# Patient Record
Sex: Female | Born: 1950 | Race: Black or African American | Smoking: Never smoker
Health system: Southern US, Community
[De-identification: ages and names within clinical notes are randomized; demographics above are authoritative.]

## PROBLEM LIST (undated history)

## (undated) DIAGNOSIS — N189 Chronic kidney disease, unspecified: Secondary | ICD-10-CM

## (undated) DIAGNOSIS — E669 Obesity, unspecified: Secondary | ICD-10-CM

## (undated) DIAGNOSIS — M1990 Unspecified osteoarthritis, unspecified site: Secondary | ICD-10-CM

## (undated) DIAGNOSIS — I10 Essential (primary) hypertension: Secondary | ICD-10-CM

## (undated) DIAGNOSIS — E119 Type 2 diabetes mellitus without complications: Secondary | ICD-10-CM

## (undated) DIAGNOSIS — E785 Hyperlipidemia, unspecified: Secondary | ICD-10-CM

## (undated) HISTORY — DX: Hyperlipidemia, unspecified: E78.5

## (undated) HISTORY — DX: Type 2 diabetes mellitus without complications: E11.9

## (undated) HISTORY — DX: Chronic kidney disease, unspecified: N18.9

## (undated) HISTORY — DX: Essential (primary) hypertension: I10

## (undated) HISTORY — PX: EYE SURGERY: SHX253

## (undated) HISTORY — DX: Obesity, unspecified: E66.9

## (undated) HISTORY — DX: Unspecified osteoarthritis, unspecified site: M19.90

---

## 2017-11-05 ENCOUNTER — Telehealth: Payer: Self-pay

## 2017-11-06 NOTE — Telephone Encounter (Signed)
Patient said it was her New Patient Paperwork. Her address was incorrect in the system. I have updated this info. It is [address] [place] [phone]

## 2017-11-06 NOTE — Telephone Encounter (Signed)
Left voice mail to call back ok for PEC to speak to patient, need to find out what call what was in reference to

## 2017-11-29 ENCOUNTER — Ambulatory Visit: Payer: Medicare HMO | Admitting: Internal Medicine

## 2017-12-26 ENCOUNTER — Telehealth: Payer: Self-pay | Admitting: Radiology

## 2017-12-26 ENCOUNTER — Ambulatory Visit: Payer: Medicare HMO

## 2017-12-26 ENCOUNTER — Ambulatory Visit (INDEPENDENT_AMBULATORY_CARE_PROVIDER_SITE_OTHER): Payer: Medicare HMO | Admitting: Internal Medicine

## 2017-12-26 ENCOUNTER — Encounter: Payer: Self-pay | Admitting: Internal Medicine

## 2017-12-26 VITALS — BP 150/70 | HR 75 | Temp 98.0°F | Ht 65.0 in | Wt 195.6 lb

## 2017-12-26 DIAGNOSIS — M25511 Pain in right shoulder: Secondary | ICD-10-CM | POA: Diagnosis not present

## 2017-12-26 DIAGNOSIS — E1122 Type 2 diabetes mellitus with diabetic chronic kidney disease: Secondary | ICD-10-CM | POA: Diagnosis not present

## 2017-12-26 DIAGNOSIS — I10 Essential (primary) hypertension: Secondary | ICD-10-CM | POA: Diagnosis not present

## 2017-12-26 DIAGNOSIS — Z23 Encounter for immunization: Secondary | ICD-10-CM

## 2017-12-26 DIAGNOSIS — E119 Type 2 diabetes mellitus without complications: Secondary | ICD-10-CM | POA: Insufficient documentation

## 2017-12-26 DIAGNOSIS — M25519 Pain in unspecified shoulder: Secondary | ICD-10-CM | POA: Insufficient documentation

## 2017-12-26 DIAGNOSIS — N183 Chronic kidney disease, stage 3 unspecified: Secondary | ICD-10-CM

## 2017-12-26 DIAGNOSIS — E785 Hyperlipidemia, unspecified: Secondary | ICD-10-CM | POA: Diagnosis not present

## 2017-12-26 DIAGNOSIS — Z1329 Encounter for screening for other suspected endocrine disorder: Secondary | ICD-10-CM

## 2017-12-26 DIAGNOSIS — E559 Vitamin D deficiency, unspecified: Secondary | ICD-10-CM | POA: Diagnosis not present

## 2017-12-26 LAB — COMPREHENSIVE METABOLIC PANEL
ALBUMIN: 4.2 g/dL (ref 3.5–5.2)
ALK PHOS: 54 U/L (ref 39–117)
ALT: 10 U/L (ref 0–35)
AST: 15 U/L (ref 0–37)
BUN: 15 mg/dL (ref 6–23)
CALCIUM: 9.7 mg/dL (ref 8.4–10.5)
CHLORIDE: 102 meq/L (ref 96–112)
CO2: 28 mEq/L (ref 19–32)
Creatinine, Ser: 1.22 mg/dL — ABNORMAL HIGH (ref 0.40–1.20)
GFR: 56.24 mL/min — AB (ref >60.00)
Glucose, Bld: 58 mg/dL — ABNORMAL LOW (ref 70–99)
POTASSIUM: 3.6 meq/L (ref 3.5–5.1)
SODIUM: 140 meq/L (ref 135–145)
TOTAL PROTEIN: 7.5 g/dL (ref 6.0–8.3)
Total Bilirubin: 0.3 mg/dL (ref 0.2–1.2)

## 2017-12-26 LAB — CBC WITH DIFFERENTIAL/PLATELET
BASOS PCT: 0.5 % (ref 0.0–3.0)
Basophils Absolute: 0 10*3/uL (ref 0.0–0.1)
EOS PCT: 1.6 % (ref 0.0–5.0)
Eosinophils Absolute: 0.1 10*3/uL (ref 0.0–0.7)
HEMATOCRIT: 35.4 % — AB (ref 36.0–46.0)
HEMOGLOBIN: 11.6 g/dL — AB (ref 12.0–15.0)
LYMPHS PCT: 28.8 % (ref 12.0–46.0)
Lymphs Abs: 2.5 10*3/uL (ref 0.7–4.0)
MCHC: 32.8 g/dL (ref 30.0–36.0)
MCV: 90 fl (ref 78.0–100.0)
MONOS PCT: 9 % (ref 3.0–12.0)
Monocytes Absolute: 0.8 10*3/uL (ref 0.1–1.0)
Neutro Abs: 5.2 10*3/uL (ref 1.4–7.7)
Neutrophils Relative %: 60.1 % (ref 43.0–77.0)
Platelets: 284 10*3/uL (ref 150.0–400.0)
RBC: 3.94 Mil/uL (ref 3.87–5.11)
RDW: 14.1 % (ref 11.5–15.5)
WBC: 8.6 10*3/uL (ref 4.0–10.5)

## 2017-12-26 LAB — URINALYSIS, ROUTINE W REFLEX MICROSCOPIC
Bilirubin Urine: NEGATIVE
HGB URINE DIPSTICK: NEGATIVE
Ketones, ur: NEGATIVE
NITRITE: NEGATIVE
RBC / HPF: NONE SEEN (ref 0–?)
Specific Gravity, Urine: 1.02 (ref 1.000–1.030)
TOTAL PROTEIN, URINE-UPE24: NEGATIVE
URINE GLUCOSE: NEGATIVE
Urobilinogen, UA: 0.2 (ref 0.0–1.0)
pH: 5.5 (ref 5.0–8.0)

## 2017-12-26 LAB — MICROALBUMIN / CREATININE URINE RATIO
Creatinine,U: 100.3 mg/dL
MICROALB/CREAT RATIO: 0.7 mg/g (ref 0.0–30.0)

## 2017-12-26 LAB — TSH: TSH: 2.45 u[IU]/mL (ref 0.35–4.50)

## 2017-12-26 LAB — HEMOGLOBIN A1C: HEMOGLOBIN A1C: 6.7 % — AB (ref 4.6–6.5)

## 2017-12-26 LAB — VITAMIN D 25 HYDROXY (VIT D DEFICIENCY, FRACTURES): VITD: 22.46 ng/mL — ABNORMAL LOW (ref 30.00–100.00)

## 2017-12-26 MED ORDER — TELMISARTAN-AMLODIPINE 80-10 MG PO TABS: 1.0000 | ORAL_TABLET | ORAL | 1 refills | Status: DC

## 2017-12-26 MED ORDER — TRUE METRIX BLOOD GLUCOSE TEST VI STRP: ORAL_STRIP | 3 refills | Status: DC

## 2017-12-26 MED ORDER — SIMVASTATIN 20 MG PO TABS: 20.0000 mg | ORAL_TABLET | Freq: Every day | ORAL | 3 refills | Status: DC

## 2017-12-26 MED ORDER — GLIPIZIDE-METFORMIN HCL 5-500 MG PO TABS
2.0000 | ORAL_TABLET | Freq: Two times a day (BID) | ORAL | 1 refills | Status: DC
Start: 2017-12-26 — End: 2017-12-29

## 2017-12-26 MED ORDER — HYDROCHLOROTHIAZIDE 25 MG PO TABS: 25.0000 mg | ORAL_TABLET | Freq: Every day | ORAL | 1 refills | Status: DC

## 2017-12-26 NOTE — Progress Notes (Signed)
Chief Complaint  Patient presents with  . 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 Needs medication refills  1. HTN out of BP meds x 2 months was on hctz 25, norvasc 10/valsartan 160 mg qd but valsartan recalled. BP elevated today  2. DM 2 last A1C 7.4 11/22/15 on Glipizide-metformin 5-500 mg 2 pills bid on zocor 20 mg rec she take qhs  Needs refills of her test strips   3. C/o right shoulder pain x 2 months no injury using OTC topical cream to shoulder     Review of Systems  Constitutional: Negative for weight loss.  HENT: Negative for hearing loss.   Eyes: Negative for blurred vision.  Respiratory: Negative for shortness of breath.   Cardiovascular: Negative for chest pain.  Gastrointestinal: Negative for abdominal pain and blood in stool.  Musculoskeletal: Positive for joint pain.  Skin: Negative for rash.  Neurological: Negative for headaches.  Psychiatric/Behavioral: Negative for memory loss.   Past Medical History:  Diagnosis Date  . Arthritis    knees  . Chronic kidney disease   . Diabetes mellitus without complication (HCC)   . Hyperlipidemia   . Hypertension   . Obesity    Past Surgical History:  Procedure Laterality Date  . EYE SURGERY     b/l cataract 2004    Family History  Problem Relation Age of Onset  . Arthritis Sister   . Diabetes Son   . Mental illness Son        bipolar   . Hyperlipidemia Son   . Hypertension Son   . Arthritis Sister   . Arthritis Sister   . Arthritis Sister   . Hyperlipidemia Sister   . Hypertension Sister    Social History   Socioeconomic History  . Marital status: Unknown    Spouse name: Not on file  . Number of children: Not on file  . Years of education: Not on file  . Highest education level: Not on file  Social Needs  . Financial resource strain: Not on file  . Food insecurity - worry: Not on file  . Food insecurity - inability: Not on file  . Transportation needs - medical:  Not on file  . Transportation needs - non-medical: Not on file  Occupational History  . Not on file  Tobacco Use  . Smoking status: Never Smoker  . Smokeless tobacco: Never Used  Substance and Sexual Activity  . Alcohol use: No    Frequency: Never  . Drug use: No  . Sexual activity: No    Partners: Male  Other Topics Concern  . Not on file  Social History Narrative   No etoh, drugs, never smoker    Walks her dog    No guns, wears seatbelt    Lives with son    Current Meds  Medication Sig  . aspirin EC 81 MG tablet Take 81 mg by mouth daily.  Marland Kitchen glipiZIDE-metformin (METAGLIP) 5-500 MG tablet Take 2 tablets by mouth 2 (two) times daily before a meal.  . hydrochlorothiazide (HYDRODIURIL) 25 MG tablet Take 1 tablet (25 mg total) by mouth daily.  . simvastatin (ZOCOR) 20 MG tablet Take 1 tablet (20 mg total) by mouth daily at 6 PM.  . TRUE METRIX BLOOD GLUCOSE TEST test strip Bid E11.9  . TRUEPLUS LANCETS 28G MISC   . [DISCONTINUED] glipiZIDE-metformin (METAGLIP) 5-500 MG tablet Take 2 tablets by mouth 2 (two) times daily before a meal.   . [  DISCONTINUED] hydrochlorothiazide (HYDRODIURIL) 25 MG tablet   . [DISCONTINUED] simvastatin (ZOCOR) 20 MG tablet   . [DISCONTINUED] TRUE METRIX BLOOD GLUCOSE TEST test strip    No Known Allergies No results found for this or any previous visit (from the past 2160 hour(s)). Objective  Body mass index is 32.55 kg/m. Wt Readings from Last 3 Encounters:  12/26/17 195 lb 9.6 oz (88.7 kg)   Temp Readings from Last 3 Encounters:  12/26/17 98 F (36.7 C) (Oral)   BP Readings from Last 3 Encounters:  12/26/17 (!) 150/70   Pulse Readings from Last 3 Encounters:  12/26/17 75   O2 sat room air 98%   Physical Exam  Constitutional: She is oriented to person, place, and time and well-developed, well-nourished, and in no distress.  HENT:  Head: Normocephalic and atraumatic.  Mouth/Throat: Oropharynx is clear and moist and mucous membranes are  normal.  Eyes: Conjunctivae are normal. Pupils are equal, round, and reactive to light.  Cardiovascular: Normal rate, regular rhythm and normal heart sounds.  Trace leg edema b/l   Pulmonary/Chest: Effort normal and breath sounds normal.  Abdominal: Soft. Bowel sounds are normal.  Musculoskeletal:       Arms: Neurological: She is alert and oriented to person, place, and time. Gait normal. Gait normal.  Skin: Skin is warm, dry and intact.  Psychiatric: Mood, memory, affect and judgment normal.  Nursing note and vitals reviewed. trace edema b/l legs   Assessment   1. HTN uncontrolled 2/2 out of meds x 2 months  2. DM 2 with CKD 3 last Creatinine 1.64 11/22/15 with GFR 37 and per notes h/o neuropathy, A1c 7.4 11/22/15 per pt cbg this am was 145 with coffee  3. HLD 4. Right shoulder pain, new x 2 months  5. H M Plan  1. Refilled hctz 25 mg qd, change norvasc 10/valsartan 160 to norvasc 10/telmisartan 80  Advised pt to take combo pill x 3 days then add hctz day 4 and continue both  F/u in 2 months  Labs today CMET, CBC,A1C, UA/protein, vit D, TSH, T4  2. Refilled glipizide-metformin 5-500 2 pills bid  Refilled strips  On ARB  Refilled statin  [facility]  Will need to do foot exam in future  3. Check lipid at f/u appt  11/22/15 TC 171, TGs 165. HDL 60  4. Ordered Xray shoulder today pt declines. Will continue to use otc topical cream  5.  [facility] flu shot [facility] per pt 09/2017  prevnar given today pna 23 needs in 1 year  Tdap disc'ed will give info at f/u  Declines to be tested hep B/C  Declines mammo, pap, colonoscopy, disc cologaurd today but mother died of colon cancer not candidate consider fobt stool tests in future, disc DEXA pt declines  "I spent 30 minutes face-to face with patient with greater than 50% of time spent counseling and/or in coordination of care reviewed PMH, psuH, prior PCP records chart reflects accordingly    Provider: Dr. French Anaracy  [facility]

## 2017-12-26 NOTE — Telephone Encounter (Signed)
Pt refused shoulder xray. Pt stated no one told her about xray.

## 2017-12-26 NOTE — Progress Notes (Signed)
Pre visit review using our clinic review tool, if applicable. No additional management support is needed unless otherwise documented below in the visit note. 

## 2017-12-26 NOTE — Telephone Encounter (Signed)
That is fine I told her about Xray  Will disc at f/u   TMS

## 2017-12-26 NOTE — Patient Instructions (Signed)
Start combination pill Telmisartan-Norvasc for 3 days  Then start hydrochlorothiazide on day 4 and continue both after that  F/u in 2 months. Am appt fasting will do bloodwork   Pneumococcal Conjugate Vaccine suspension for injection What is this medicine? PNEUMOCOCCAL VACCINE (NEU mo KOK al vak SEEN) is a vaccine used to prevent pneumococcus bacterial infections. These bacteria can cause serious infections like pneumonia, meningitis, and blood infections. This vaccine will lower your chance of getting pneumonia. If you do get pneumonia, it can make your symptoms milder and your illness shorter. This vaccine will not treat an infection and will not cause infection. This vaccine is recommended for infants and young children, adults with certain medical conditions, and adults 65 years or older. This medicine may be used for other purposes; ask your health care provider or pharmacist if you have questions. COMMON BRAND NAME(S): Prevnar, Prevnar 13 What should I tell my health care provider before I take this medicine? They need to know if you have any of these conditions: -bleeding problems -fever -immune system problems -an unusual or allergic reaction to pneumococcal vaccine, diphtheria toxoid, other vaccines, latex, other medicines, foods, dyes, or preservatives -pregnant or trying to get pregnant -breast-feeding How should I use this medicine? This vaccine is for injection into a muscle. It is given by a health care professional. A copy of Vaccine Information Statements will be given before each vaccination. Read this sheet carefully each time. The sheet may change frequently. Talk to your pediatrician regarding the use of this medicine in children. While this drug may be prescribed for children as young as 63 weeks old for selected conditions, precautions do apply. Overdosage: If you think you have taken too much of this medicine contact a poison control center or emergency room at once. NOTE:  This medicine is only for you. Do not share this medicine with others. What if I miss a dose? It is important not to miss your dose. Call your doctor or health care professional if you are unable to keep an appointment. What may interact with this medicine? -medicines for cancer chemotherapy -medicines that suppress your immune function -steroid medicines like prednisone or cortisone This list may not describe all possible interactions. Give your health care provider a list of all the medicines, herbs, non-prescription drugs, or dietary supplements you use. Also tell them if you smoke, drink alcohol, or use illegal drugs. Some items may interact with your medicine. What should I watch for while using this medicine? Mild fever and pain should go away in 3 days or less. Report any unusual symptoms to your doctor or health care professional. What side effects may I notice from receiving this medicine? Side effects that you should report to your doctor or health care professional as soon as possible: -allergic reactions like skin rash, itching or hives, swelling of the face, lips, or tongue -breathing problems -confused -fast or irregular heartbeat -fever over 102 degrees F -seizures -unusual bleeding or bruising -unusual muscle weakness Side effects that usually do not require medical attention (report to your doctor or health care professional if they continue or are bothersome): -aches and pains -diarrhea -fever of 102 degrees F or less -headache -irritable -loss of appetite -pain, tender at site where injected -trouble sleeping This list may not describe all possible side effects. Call your doctor for medical advice about side effects. You may report side effects to FDA at 1-800-FDA-1088. Where should I keep my medicine? This does not apply. This vaccine is  given in a clinic, pharmacy, doctor's office, or other health care setting and will not be stored at home. NOTE: This sheet is a  summary. It may not cover all possible information. If you have questions about this medicine, talk to your doctor, pharmacist, or health care provider.  2018 Elsevier/Gold Standard (2014-07-09 10:27:27)

## 2017-12-29 ENCOUNTER — Other Ambulatory Visit: Payer: Self-pay | Admitting: Internal Medicine

## 2017-12-29 DIAGNOSIS — E559 Vitamin D deficiency, unspecified: Secondary | ICD-10-CM

## 2017-12-29 DIAGNOSIS — E1122 Type 2 diabetes mellitus with diabetic chronic kidney disease: Secondary | ICD-10-CM

## 2017-12-29 MED ORDER — GLIPIZIDE-METFORMIN HCL 5-500 MG PO TABS: ORAL_TABLET | ORAL | 1 refills | Status: DC

## 2017-12-29 MED ORDER — CHOLECALCIFEROL 1.25 MG (50000 UT) PO CAPS: 50000.0000 [IU] | ORAL_CAPSULE | ORAL | 1 refills | Status: DC

## 2018-02-06 ENCOUNTER — Telehealth: Payer: Self-pay | Admitting: Internal Medicine

## 2018-02-06 NOTE — Telephone Encounter (Unsigned)
Copied from CRM [id number]. Topic: Quick Communication - See Telephone Encounter >> Feb 06, 2018 12:10 PM Floria RavelingStovall, Shana A wrote: CRM for notification. See Telephone encounter for: 02/06/18. Pt called stated that the new bp med that was called in this month, pharmacy does not have it in.  They are unsure when they ware going to get it in.  She did not know the name of this med. She wanted to know if dr could call in different bp med?    Pharmacy - [facility] on file- verified

## 2018-02-06 NOTE — Telephone Encounter (Signed)
Please advise. It is the combo amlodipine medication.

## 2018-02-07 ENCOUNTER — Other Ambulatory Visit: Payer: Self-pay | Admitting: Internal Medicine

## 2018-02-07 DIAGNOSIS — I10 Essential (primary) hypertension: Secondary | ICD-10-CM

## 2018-02-07 MED ORDER — TELMISARTAN 40 MG PO TABS: 40.0000 mg | ORAL_TABLET | Freq: Every day | ORAL | 0 refills | Status: DC

## 2018-02-07 MED ORDER — AMLODIPINE BESYLATE 10 MG PO TABS: 10.0000 mg | ORAL_TABLET | Freq: Every day | ORAL | 0 refills | Status: DC

## 2018-02-07 NOTE — Telephone Encounter (Signed)
Sent telmisartan 40, norvasc 10 instead of combination pill

## 2018-02-08 NOTE — Telephone Encounter (Signed)
Patient has been informed of below.  

## 2018-02-08 NOTE — Telephone Encounter (Signed)
Left message for patient to return call back. PEC may inform patient 

## 2018-02-28 ENCOUNTER — Ambulatory Visit: Payer: Medicare HMO | Admitting: Internal Medicine

## 2018-04-02 ENCOUNTER — Encounter: Payer: Self-pay | Admitting: Internal Medicine

## 2018-04-02 ENCOUNTER — Ambulatory Visit (INDEPENDENT_AMBULATORY_CARE_PROVIDER_SITE_OTHER): Payer: Medicare HMO | Admitting: Internal Medicine

## 2018-04-02 VITALS — BP 126/62 | HR 74 | Temp 98.4°F | Ht 65.0 in | Wt 183.4 lb

## 2018-04-02 DIAGNOSIS — E559 Vitamin D deficiency, unspecified: Secondary | ICD-10-CM | POA: Insufficient documentation

## 2018-04-02 DIAGNOSIS — I10 Essential (primary) hypertension: Secondary | ICD-10-CM | POA: Diagnosis not present

## 2018-04-02 DIAGNOSIS — E785 Hyperlipidemia, unspecified: Secondary | ICD-10-CM

## 2018-04-02 DIAGNOSIS — E1122 Type 2 diabetes mellitus with diabetic chronic kidney disease: Secondary | ICD-10-CM

## 2018-04-02 LAB — LIPID PANEL
CHOL/HDL RATIO: 4
Cholesterol: 259 mg/dL — ABNORMAL HIGH (ref 0–200)
HDL: 61.6 mg/dL (ref >39.00)
LDL CALC: 168 mg/dL — AB (ref 0–99)
NONHDL: 197.1
Triglycerides: 148 mg/dL (ref 0.0–149.0)
VLDL: 29.6 mg/dL (ref 0.0–40.0)

## 2018-04-02 LAB — BASIC METABOLIC PANEL
BUN: 20 mg/dL (ref 6–23)
CALCIUM: 10 mg/dL (ref 8.4–10.5)
CHLORIDE: 102 meq/L (ref 96–112)
CO2: 28 meq/L (ref 19–32)
CREATININE: 1.73 mg/dL — AB (ref 0.40–1.20)
GFR: 37.56 mL/min — ABNORMAL LOW (ref >60.00)
Glucose, Bld: 107 mg/dL — ABNORMAL HIGH (ref 70–99)
Potassium: 4 mEq/L (ref 3.5–5.1)
SODIUM: 140 meq/L (ref 135–145)

## 2018-04-02 LAB — HEMOGLOBIN A1C: Hgb A1c MFr Bld: 6.9 % — ABNORMAL HIGH (ref 4.6–6.5)

## 2018-04-02 MED ORDER — GLIPIZIDE-METFORMIN HCL 5-500 MG PO TABS: ORAL_TABLET | ORAL | 3 refills | Status: DC

## 2018-04-02 MED ORDER — SIMVASTATIN 20 MG PO TABS: 20.0000 mg | ORAL_TABLET | Freq: Every day | ORAL | 3 refills | Status: DC

## 2018-04-02 MED ORDER — CHOLECALCIFEROL 1.25 MG (50000 UT) PO CAPS: 50000.0000 [IU] | ORAL_CAPSULE | ORAL | 1 refills | Status: DC

## 2018-04-02 MED ORDER — ASPIRIN EC 81 MG PO TBEC: 81.0000 mg | DELAYED_RELEASE_TABLET | Freq: Every day | ORAL | 3 refills | Status: DC

## 2018-04-02 MED ORDER — TRUEPLUS LANCETS 28G MISC: 3.0000 | Freq: Three times a day (TID) | 11 refills | Status: DC

## 2018-04-02 MED ORDER — AMLODIPINE BESYLATE 10 MG PO TABS: 10.0000 mg | ORAL_TABLET | Freq: Every day | ORAL | 3 refills | Status: DC

## 2018-04-02 MED ORDER — HYDROCHLOROTHIAZIDE 25 MG PO TABS: 25.0000 mg | ORAL_TABLET | Freq: Every day | ORAL | 3 refills | Status: DC

## 2018-04-02 MED ORDER — TELMISARTAN 40 MG PO TABS: 40.0000 mg | ORAL_TABLET | Freq: Every day | ORAL | 3 refills | Status: DC

## 2018-04-02 MED ORDER — TRUE METRIX BLOOD GLUCOSE TEST VI STRP: ORAL_STRIP | 3 refills | Status: DC

## 2018-04-02 NOTE — Progress Notes (Signed)
Pre visit review using our clinic review tool, if applicable. No additional management support is needed unless otherwise documented below in the visit note. 

## 2018-04-02 NOTE — Patient Instructions (Addendum)
Wear sandals visit so we can check feet  And have [employer] fax me the results of the stool kit  F/u 3-4 months  Please make eye doctor appt at [facility]    Hypoglycemia Hypoglycemia is when the sugar (glucose) level in the blood is too low. Symptoms of low blood sugar may include:  Feeling: ? Hungry. ? Worried or nervous (anxious). ? Sweaty and clammy. ? Confused. ? Dizzy. ? Sleepy. ? Sick to your stomach (nauseous).  Having: ? A fast heartbeat. ? A headache. ? A change in your vision. ? Jerky movements that you cannot control (seizure). ? Nightmares. ? Tingling or no feeling (numbness) around the mouth, lips, or tongue.  Having trouble with: ? Talking. ? Paying attention (concentrating). ? Moving (coordination). ? Sleeping.  Shaking.  Passing out (fainting).  Getting upset easily (irritability).  Low blood sugar can happen to people who have diabetes and people who do not have diabetes. Low blood sugar can happen quickly, and it can be an emergency. Treating Low Blood Sugar Low blood sugar is often treated by eating or drinking something sugary right away. If you can think clearly and swallow safely, follow the 15:15 rule:  Take 15 grams of a fast-acting carb (carbohydrate). Some fast-acting carbs are: ? 1 tube of glucose gel. ? 3 sugar tablets (glucose pills). ? 6-8 pieces of hard candy. ? 4 oz (120 mL) of fruit juice. ? 4 oz (120 mL) of regular (not diet) soda.  Check your blood sugar 15 minutes after you take the carb.  If your blood sugar is still at or below 70 mg/dL (3.9 mmol/L), take 15 grams of a carb again.  If your blood sugar does not go above 70 mg/dL (3.9 mmol/L) after 3 tries, get help right away.  After your blood sugar goes back to normal, eat a meal or a snack within 1 hour.  Treating Very Low Blood Sugar If your blood sugar is at or below 54 mg/dL (3 mmol/L), you have very low blood sugar (severe hypoglycemia). This is an emergency. Do not wait  to see if the symptoms will go away. Get medical help right away. Call your local emergency services (911 in the [place] Do not drive yourself to the hospital. If you have very low blood sugar and you cannot eat or drink, you may need a glucagon shot (injection). A family member or friend should learn how to check your blood sugar and how to give you a glucagon shot. Ask your doctor if you need to have a glucagon shot kit at home. Follow these instructions at home: General instructions  Avoid any diets that cause you to not eat enough food. Talk with your doctor before you start any new diet.  Take over-the-counter and prescription medicines only as told by your doctor.  Limit alcohol to no more than 1 drink per day for nonpregnant women and 2 drinks per day for men. One drink equals 12 oz of beer, 5 oz of wine, or 1 oz of hard liquor.  Keep all follow-up visits as told by your doctor. This is important. If You Have Diabetes:   Make sure you know the symptoms of low blood sugar.  Always keep a source of sugar with you, such as: ? Sugar. ? Sugar tablets. ? Glucose gel. ? Fruit juice. ? Regular soda (not diet soda). ? Milk. ? Hard candy. ? Honey.  Take your medicines as told.  Follow your exercise and meal plan. ? Eat  on time. Do not skip meals. ? Follow your sick day plan when you cannot eat or drink normally. Make this plan ahead of time with your doctor.  Check your blood sugar as often as told by your doctor. Always check before and after exercise.  Share your diabetes care plan with: ? Your work or school. ? People you live with.  Check your pee (urine) for ketones: ? When you are sick. ? As told by your doctor.  Carry a card or wear jewelry that says you have diabetes. If You Have Low Blood Sugar From Other Causes:   Check your blood sugar as often as told by your doctor.  Follow instructions from your doctor about what you cannot eat or drink. Contact a doctor  if:  You have trouble keeping your blood sugar in your target range.  You have low blood sugar often. Get help right away if:  You still have symptoms after you eat or drink something sugary.  Your blood sugar is at or below 54 mg/dL (3 mmol/L).  You have jerky movements that you cannot control.  You pass out. These symptoms may be an emergency. Do not wait to see if the symptoms will go away. Get medical help right away. Call your local emergency services (911 in the [place] Do not drive yourself to the hospital. This information is not intended to replace advice given to you by your health care provider. Make sure you discuss any questions you have with your health care provider. Document Released: 12/27/2009 Document Revised: 03/09/2016 Document Reviewed: 11/05/2015 Elsevier Interactive Patient Education  [employer].

## 2018-04-02 NOTE — Progress Notes (Signed)
Chief Complaint  Patient presents with  . Follow-up   F/u  1. HTN controlled on norvasc 10, hctz 25, telmisartan 40 2. DM 2 last A1C 6.7 on metacip 5-500 2 pills in am and 1 in pm. [facility] checking cbgs 2-3 x per day 79-131 readings at home  3. Right shoulder pain resolved  4. Vit D def taking weekly vitamin D 3   Review of Systems  Constitutional:       Down 12 lbs since last visit   HENT: Negative for hearing loss.   Eyes: Negative for blurred vision.  Respiratory: Negative for shortness of breath.   Cardiovascular: Negative for chest pain.  Gastrointestinal: Negative for abdominal pain.  Musculoskeletal: Negative for joint pain.  Skin: Negative for rash.  Neurological: Negative for headaches.  Psychiatric/Behavioral: Negative for depression.   Past Medical History:  Diagnosis Date  . Arthritis    knees  . Chronic kidney disease   . Diabetes mellitus without complication (HCC)   . Hyperlipidemia   . Hypertension   . Obesity    Past Surgical History:  Procedure Laterality Date  . EYE SURGERY     b/l cataract 2004    Family History  Problem Relation Age of Onset  . Arthritis Sister   . Diabetes Son   . Mental illness Son        bipolar   . Hyperlipidemia Son   . Hypertension Son   . Arthritis Sister   . Arthritis Sister   . Arthritis Sister   . Hyperlipidemia Sister   . Hypertension Sister   . Cancer Mother        colon cancer   . Cancer Father        lung, smoker   Social History   Socioeconomic History  . Marital status: Unknown    Spouse name: Not on file  . Number of children: Not on file  . Years of education: Not on file  . Highest education level: Not on file  Occupational History  . Not on file  Social Needs  . Financial resource strain: Not on file  . Food insecurity:    Worry: Not on file    Inability: Not on file  . Transportation needs:    Medical: Not on file    Non-medical: Not on file  Tobacco  Use  . Smoking status: Never Smoker  . Smokeless tobacco: Never Used  Substance and Sexual Activity  . Alcohol use: No    Frequency: Never  . Drug use: No  . Sexual activity: Never    Partners: Male  Lifestyle  . Physical activity:    Days per week: Not on file    Minutes per session: Not on file  . Stress: Not on file  Relationships  . Social connections:    Talks on phone: Not on file    Gets together: Not on file    Attends religious service: Not on file    Active member of club or organization: Not on file    Attends meetings of clubs or organizations: Not on file    Relationship status: Not on file  . Intimate partner violence:    Fear of current or ex partner: Not on file    Emotionally abused: Not on file    Physically abused: Not on file    Forced sexual activity: Not on file  Other Topics Concern  . Not on file  Social  History Narrative   No etoh, drugs, never smoker    Walks her dog    No guns, wears seatbelt    Lives with son    Used to be Cytogeneticist    Current Meds  Medication Sig  . amLODipine (NORVASC) 10 MG tablet Take 1 tablet (10 mg total) by mouth daily.  Marland Kitchen aspirin EC 81 MG tablet Take 81 mg by mouth daily.  . Cholecalciferol 50000 units capsule Take 1 capsule (50,000 Units total) by mouth once a week.  Marland Kitchen glipiZIDE-metformin (METAGLIP) 5-500 MG tablet 2 pills in am with food and 1 pill at night  . hydrochlorothiazide (HYDRODIURIL) 25 MG tablet Take 1 tablet (25 mg total) by mouth daily.  . simvastatin (ZOCOR) 20 MG tablet Take 1 tablet (20 mg total) by mouth daily at 6 PM.  . telmisartan (MICARDIS) 40 MG tablet Take 1 tablet (40 mg total) by mouth daily.  . TRUE METRIX BLOOD GLUCOSE TEST test strip Bid E11.9  . TRUEPLUS LANCETS 28G MISC    No Known Allergies No results found for this or any previous visit (from the past 2160 hour(s)). Objective  Body mass index is 30.52 kg/m. Wt Readings from Last 3 Encounters:  04/02/18 183 lb 6.4 oz (83.2 kg)   12/26/17 195 lb 9.6 oz (88.7 kg)   Temp Readings from Last 3 Encounters:  04/02/18 98.4 F (36.9 C) (Oral)  12/26/17 98 F (36.7 C) (Oral)   BP Readings from Last 3 Encounters:  04/02/18 126/62  12/26/17 (!) 150/70   Pulse Readings from Last 3 Encounters:  04/02/18 74  12/26/17 75    Physical Exam  Constitutional: She is oriented to person, place, and time. Vital signs are normal. She appears well-developed and well-nourished. She is cooperative.  HENT:  Head: Normocephalic and atraumatic.  Mouth/Throat: Oropharynx is clear and moist and mucous membranes are normal.  Eyes: Pupils are equal, round, and reactive to light. Conjunctivae are normal.  Cardiovascular: Normal rate, regular rhythm and normal heart sounds.  Pulmonary/Chest: Effort normal and breath sounds normal.  Neurological: She is alert and oriented to person, place, and time. Gait normal.  Skin: Skin is warm, dry and intact.  Psychiatric: She has a normal mood and affect. Her speech is normal and behavior is normal. Judgment and thought content normal. Cognition and memory are normal.  Nursing note and vitals reviewed.   Assessment   1. HTN 2. DM 2 A1C 6.7  3. Vit D def  4. Right shoulder pain resolved  5. HM Plan  1.  Cont meds  2.  Cont meds  Pt will sch eye exam [facility] before next visit  She declines foot exam today will do at f/u  Had urine exam  3. Cont vit D  4. Monitor resolved for now  5.  Had flu shot [facility] per pt 09/2017  prevnar had, pna 23 due 12/2018  Tdap disc'ed will give Rx at f/u  Declines to be tested hep B/C  Declines mammo, pap, colonoscopy, cologaurd ( mother died of colon cancer not candidate) consider fobt stool tests in future (pt to have through insurance mail order and will fax me result of fobt), disc DEXA pt declines  Provider: Dr. French [facility]

## 2018-04-08 ENCOUNTER — Telehealth: Payer: Self-pay | Admitting: Internal Medicine

## 2018-04-08 NOTE — Telephone Encounter (Signed)
Copied from CRM [id number]. Topic: Quick Communication - Rx Refill/Question >> Apr 08, 2018  1:02 PM Alexander BergeronBarksdale, Harvey B wrote: Pt called b/c she had received a VM from [employer] concerning some medication(s) which she(pt) knows nothing of; but the insurance company or pharmacy is needing a form faxed back to them so that the medications can be filled; contact pt or pharmacy if needed

## 2018-04-09 NOTE — Telephone Encounter (Signed)
Left message to call back and discuss which medications [employer] was calling about.

## 2018-04-09 NOTE — Telephone Encounter (Signed)
Pt will call [facility] to see which medications they are talking about

## 2018-04-23 ENCOUNTER — Other Ambulatory Visit: Payer: Self-pay | Admitting: Internal Medicine

## 2018-04-23 DIAGNOSIS — N179 Acute kidney failure, unspecified: Secondary | ICD-10-CM

## 2018-04-23 DIAGNOSIS — R7989 Other specified abnormal findings of blood chemistry: Secondary | ICD-10-CM

## 2018-04-23 DIAGNOSIS — E785 Hyperlipidemia, unspecified: Secondary | ICD-10-CM

## 2018-04-23 DIAGNOSIS — N183 Chronic kidney disease, stage 3 (moderate): Principal | ICD-10-CM

## 2018-04-23 DIAGNOSIS — I10 Essential (primary) hypertension: Secondary | ICD-10-CM

## 2018-04-23 MED ORDER — ATORVASTATIN CALCIUM 40 MG PO TABS
40.0000 mg | ORAL_TABLET | Freq: Every day | ORAL | 3 refills | Status: DC
Start: 2018-04-23 — End: 2019-01-30

## 2018-04-23 MED ORDER — HYDROCHLOROTHIAZIDE 12.5 MG PO TABS: 12.5000 mg | ORAL_TABLET | Freq: Every day | ORAL | 1 refills | Status: DC

## 2018-04-23 NOTE — Progress Notes (Signed)
Will cut HCTZ to 12.5 mg qd, change zocor 20 to lipitor 40  Pt to sch labs 05/06/18 week Disc diet changes  If kidney #s remain elevated after changes consider renal US    TMS

## 2018-08-02 ENCOUNTER — Ambulatory Visit: Payer: Medicare HMO

## 2018-08-02 ENCOUNTER — Ambulatory Visit: Payer: Medicare HMO | Admitting: Internal Medicine

## 2018-08-12 ENCOUNTER — Ambulatory Visit: Payer: 59

## 2018-08-20 ENCOUNTER — Other Ambulatory Visit: Payer: Self-pay | Admitting: Internal Medicine

## 2018-08-20 DIAGNOSIS — I10 Essential (primary) hypertension: Secondary | ICD-10-CM

## 2018-09-05 ENCOUNTER — Encounter: Payer: Self-pay | Admitting: Internal Medicine

## 2018-09-05 ENCOUNTER — Ambulatory Visit (INDEPENDENT_AMBULATORY_CARE_PROVIDER_SITE_OTHER): Payer: Medicare HMO

## 2018-09-05 ENCOUNTER — Ambulatory Visit (INDEPENDENT_AMBULATORY_CARE_PROVIDER_SITE_OTHER): Payer: Medicare HMO | Admitting: Internal Medicine

## 2018-09-05 VITALS — BP 122/64 | HR 83 | Temp 98.3°F | Resp 14 | Ht 64.0 in | Wt 170.6 lb

## 2018-09-05 VITALS — BP 122/64 | HR 83 | Temp 98.3°F | Ht 64.0 in | Wt 170.6 lb

## 2018-09-05 DIAGNOSIS — E1122 Type 2 diabetes mellitus with diabetic chronic kidney disease: Secondary | ICD-10-CM | POA: Diagnosis not present

## 2018-09-05 DIAGNOSIS — Z0000 Encounter for general adult medical examination without abnormal findings: Secondary | ICD-10-CM | POA: Diagnosis not present

## 2018-09-05 DIAGNOSIS — Z23 Encounter for immunization: Secondary | ICD-10-CM

## 2018-09-05 DIAGNOSIS — E785 Hyperlipidemia, unspecified: Secondary | ICD-10-CM | POA: Diagnosis not present

## 2018-09-05 DIAGNOSIS — I10 Essential (primary) hypertension: Secondary | ICD-10-CM

## 2018-09-05 DIAGNOSIS — N183 Chronic kidney disease, stage 3 unspecified: Secondary | ICD-10-CM

## 2018-09-05 DIAGNOSIS — R634 Abnormal weight loss: Secondary | ICD-10-CM

## 2018-09-05 DIAGNOSIS — E559 Vitamin D deficiency, unspecified: Secondary | ICD-10-CM | POA: Diagnosis not present

## 2018-09-05 DIAGNOSIS — E119 Type 2 diabetes mellitus without complications: Secondary | ICD-10-CM | POA: Diagnosis not present

## 2018-09-05 DIAGNOSIS — N179 Acute kidney failure, unspecified: Secondary | ICD-10-CM | POA: Diagnosis not present

## 2018-09-05 DIAGNOSIS — N189 Chronic kidney disease, unspecified: Secondary | ICD-10-CM

## 2018-09-05 LAB — CBC WITH DIFFERENTIAL/PLATELET
Basophils Absolute: 0 10*3/uL (ref 0.0–0.1)
Basophils Relative: 0.5 % (ref 0.0–3.0)
EOS ABS: 0.1 10*3/uL (ref 0.0–0.7)
Eosinophils Relative: 0.9 % (ref 0.0–5.0)
HEMATOCRIT: 35 % — AB (ref 36.0–46.0)
Hemoglobin: 11.6 g/dL — ABNORMAL LOW (ref 12.0–15.0)
LYMPHS PCT: 21.6 % (ref 12.0–46.0)
Lymphs Abs: 2 10*3/uL (ref 0.7–4.0)
MCHC: 33 g/dL (ref 30.0–36.0)
MCV: 88.6 fl (ref 78.0–100.0)
Monocytes Absolute: 0.6 10*3/uL (ref 0.1–1.0)
Monocytes Relative: 6 % (ref 3.0–12.0)
NEUTROS ABS: 6.6 10*3/uL (ref 1.4–7.7)
Neutrophils Relative %: 71 % (ref 43.0–77.0)
PLATELETS: 291 10*3/uL (ref 150.0–400.0)
RBC: 3.95 Mil/uL (ref 3.87–5.11)
RDW: 14.3 % (ref 11.5–15.5)
WBC: 9.3 10*3/uL (ref 4.0–10.5)

## 2018-09-05 LAB — COMPREHENSIVE METABOLIC PANEL
ALBUMIN: 4.3 g/dL (ref 3.5–5.2)
ALT: 13 U/L (ref 0–35)
AST: 20 U/L (ref 0–37)
Alkaline Phosphatase: 63 U/L (ref 39–117)
BUN: 24 mg/dL — ABNORMAL HIGH (ref 6–23)
CALCIUM: 10.2 mg/dL (ref 8.4–10.5)
CO2: 29 mEq/L (ref 19–32)
CREATININE: 1.97 mg/dL — AB (ref 0.40–1.20)
Chloride: 101 mEq/L (ref 96–112)
GFR: 32.29 mL/min — ABNORMAL LOW (ref >60.00)
Glucose, Bld: 102 mg/dL — ABNORMAL HIGH (ref 70–99)
Potassium: 4.3 mEq/L (ref 3.5–5.1)
Sodium: 139 mEq/L (ref 135–145)
Total Bilirubin: 0.5 mg/dL (ref 0.2–1.2)
Total Protein: 7.5 g/dL (ref 6.0–8.3)

## 2018-09-05 LAB — HEMOGLOBIN A1C: Hgb A1c MFr Bld: 6.9 % — ABNORMAL HIGH (ref 4.6–6.5)

## 2018-09-05 LAB — LIPID PANEL
CHOLESTEROL: 145 mg/dL (ref 0–200)
HDL: 60.2 mg/dL (ref >39.00)
LDL CALC: 72 mg/dL (ref 0–99)
NonHDL: 84.77
Total CHOL/HDL Ratio: 2
Triglycerides: 65 mg/dL (ref 0.0–149.0)
VLDL: 13 mg/dL (ref 0.0–40.0)

## 2018-09-05 MED ORDER — AMLODIPINE BESYLATE 10 MG PO TABS: 5.0000 mg | ORAL_TABLET | Freq: Every day | ORAL | 3 refills | Status: DC

## 2018-09-05 NOTE — Patient Instructions (Addendum)
  [name] , Thank you for taking time to come for your Medicare Wellness Visit. I appreciate your ongoing commitment to your health goals. Please review the following plan we discussed and let me know if I can assist you in the future.   These are the goals we discussed: Goals      Patient Stated   . Healthy Lifestyle (pt-stated)     Stay active Compliant with medications Drink plenty of fluids Eat healthy       This is a list of the screening recommended for you and due dates:  Health Maintenance  Topic Date Due  . Complete foot exam   03/27/1959  . Eye exam for diabetics  03/27/1959  . Tetanus Vaccine  03/26/1968  . Mammogram  12/27/2018*  . DEXA scan (bone density measurement)  12/27/2018*  . Colon Cancer Screening  12/27/2018*  .  Hepatitis C: One time screening is recommended by Center for Disease Control  (CDC) for  adults born from [birth year] through 1967.   12/27/2018*  . Hemoglobin A1C  10/02/2018  . Pneumonia vaccines (2 of 2 - PPSV23) 12/27/2018  . Flu Shot  Completed  *Topic was postponed. The date shown is not the original due date.

## 2018-09-05 NOTE — Progress Notes (Signed)
Chief Complaint  Patient presents with  . Follow-up   F/u  1. AKI on CKD 3 she has increased water intake 2. HTN controlled low normal on norvasc 10 mg qd, hctz 12.5 mg qd, telmisartan 40 mg qd  3. DM 2 last A1C 6.7 on metaglip   Review of Systems  Constitutional: Positive for weight loss.       Down 25#s since 12/2017 per pt states appetite low and eating less   HENT: Negative for hearing loss.   Eyes: Negative for blurred vision.  Respiratory: Negative for shortness of breath.   Cardiovascular: Negative for chest pain.  Gastrointestinal: Negative for abdominal pain.       +reduced appetite    Musculoskeletal: Negative for joint pain.  Skin: Negative for rash.  Neurological: Negative for headaches.  Psychiatric/Behavioral: Negative for depression.   Past Medical History:  Diagnosis Date  . Arthritis    knees  . Chronic kidney disease   . Diabetes mellitus without complication (HCC)   . Hyperlipidemia   . Hypertension   . Obesity    Past Surgical History:  Procedure Laterality Date  . EYE SURGERY     b/l cataract 2004    Family History  Problem Relation Age of Onset  . Arthritis Sister   . Hypertension Sister   . Hyperlipidemia Sister   . Diabetes Son   . Mental illness Son        bipolar   . Hyperlipidemia Son   . Hypertension Son   . Arthritis Sister   . Hypertension Sister   . Hyperlipidemia Sister   . Arthritis Sister   . Hypertension Sister   . Hyperlipidemia Sister   . Arthritis Sister   . Hyperlipidemia Sister   . Hypertension Sister   . Cancer Mother        colon cancer   . Arthritis Mother   . Cancer Father        lung, smoker   Social History   Socioeconomic History  . Marital status: Unknown    Spouse name: Not on file  . Number of children: Not on file  . Years of education: Not on file  . Highest education level: Not on file  Occupational History  . Not on file  Social Needs  . Financial resource strain: Not hard at all  . Food  insecurity:    Worry: Never true    Inability: Never true  . Transportation needs:    Medical: No    Non-medical: No  Tobacco Use  . Smoking status: Never Smoker  . Smokeless tobacco: Never Used  Substance and Sexual Activity  . Alcohol use: No    Frequency: Never  . Drug use: No  . Sexual activity: Never    Partners: Male  Lifestyle  . Physical activity:    Days per week: 7 days    Minutes per session: 30 min  . Stress: Not at all  Relationships  . Social connections:    Talks on phone: Not on file    Gets together: Not on file    Attends religious service: Not on file    Active member of club or organization: Not on file    Attends meetings of clubs or organizations: Not on file    Relationship status: Not on file  . Intimate partner violence:    Fear of current or ex partner: Not on file    Emotionally abused: Not on file    Physically abused:  Not on file    Forced sexual activity: Not on file  Other Topics Concern  . Not on file  Social History Narrative   No etoh, drugs, never smoker    Walks her dog    No guns, wears seatbelt    Lives with son age 69 as of 03/2018    Used to be Cytogeneticistmail worker    Current Meds  Medication Sig  . amLODipine (NORVASC) 10 MG tablet Take 1 tablet (10 mg total) by mouth daily.  Marland Kitchen. aspirin EC 81 MG tablet Take 1 tablet (81 mg total) by mouth daily.  Marland Kitchen. atorvastatin (LIPITOR) 40 MG tablet Take 1 tablet (40 mg total) by mouth daily at 6 PM.  . Cholecalciferol 50000 units capsule Take 1 capsule (50,000 Units total) by mouth once a week.  Marland Kitchen. glipiZIDE-metformin (METAGLIP) 5-500 MG tablet 2 pills in am with food and 1 pill at night  . hydrochlorothiazide (HYDRODIURIL) 12.5 MG tablet TAKE 1 TABLET EVERY MORNING  . simvastatin (ZOCOR) 20 MG tablet Take 1 tablet (20 mg total) by mouth daily at 6 PM.  . telmisartan (MICARDIS) 40 MG tablet Take 1 tablet (40 mg total) by mouth daily.  . TRUE METRIX BLOOD GLUCOSE TEST test strip Bid E11.9  . TRUEPLUS  LANCETS 28G MISC 3 Devices by Other route 3 (three) times daily.   No Known Allergies No results found for this or any previous visit (from the past 2160 hour(s)). Objective  Body mass index is 29.28 kg/m. Wt Readings from Last 3 Encounters:  09/05/18 170 lb 9.6 oz (77.4 kg)  09/05/18 170 lb 9.6 oz (77.4 kg)  04/02/18 183 lb 6.4 oz (83.2 kg)   Temp Readings from Last 3 Encounters:  09/05/18 98.3 F (36.8 C) (Oral)  09/05/18 98.3 F (36.8 C) (Oral)  04/02/18 98.4 F (36.9 C) (Oral)   BP Readings from Last 3 Encounters:  09/05/18 122/64  09/05/18 122/64  04/02/18 126/62   Pulse Readings from Last 3 Encounters:  09/05/18 83  09/05/18 83  04/02/18 74    Physical Exam  Constitutional: She is oriented to person, place, and time. Vital signs are normal. She appears well-developed and well-nourished. She is cooperative.  HENT:  Head: Normocephalic and atraumatic.  Mouth/Throat: Oropharynx is clear and moist and mucous membranes are normal.  Eyes: Pupils are equal, round, and reactive to light. Conjunctivae are normal.  Cardiovascular: Normal rate, regular rhythm and normal heart sounds.  Pulmonary/Chest: Effort normal and breath sounds normal.  Neurological: She is alert and oriented to person, place, and time. Gait normal.  Skin: Skin is warm, dry and intact.  Psychiatric: She has a normal mood and affect. Her speech is normal and behavior is normal. Judgment and thought content normal. Cognition and memory are normal.  Nursing note and vitals reviewed.   Assessment   1. HTN 2. DM 2  3. AKI on CKD 3  4. Weight loss 25 lbs since 12/2017  5. HM Plan  1. Cont meds reduce norvasc to 5 mg  2. Cont meds  On ARB  On statin  Eye exam [facility] 2020  Monofilament foot exam normal today  3.  W/u urine labs, SPEP, UPEP repeat Cr Reduce norvasc to 5 in case hypotension related  4. Monitor  Pt declines HM w/u not and in the past will think about mammogram today 5  Had flu shot  today  prevnar had, pna 23 due 12/2018  Tdap disc'ed will give Rx at f/u  Declines to be tested hep B/C  Declines mammo, pap, colonoscopy, cologaurd ( mother died of colon cancer not candidate) consider fobt stool tests in future (pt to have through insurance mail order and will fax me result of fobt), disc DEXA pt declines .  Provider: Dr. French Ana McLean-Scocuzza-Internal Medicine

## 2018-09-05 NOTE — Progress Notes (Signed)
Subjective:   Ebony Reese is a 67 y.o. female who presents for an Initial Medicare Annual Wellness Visit.  Review of Systems    No ROS.  Medicare Wellness Visit. Additional risk factors are reflected in the social history.  Cardiac Risk Factors include: hypertension;diabetes mellitus     Objective:    Today's Vitals   09/05/18 1026  BP: 122/64  Pulse: 83  Resp: 14  Temp: 98.3 F (36.8 C)  TempSrc: Oral  SpO2: 99%  Weight: 170 lb 9.6 oz (77.4 kg)  Height: 5\' 4"  (1.626 m)   Body mass index is 29.28 kg/m.  Advanced Directives 09/05/2018  Does Patient Have a Medical Advance Directive? No  Would patient like information on creating a medical advance directive? No - Patient declined    Current Medications (verified) Outpatient Encounter Medications as of 09/05/2018  Medication Sig  . amLODipine (NORVASC) 10 MG tablet Take 1 tablet (10 mg total) by mouth daily.  Marland Kitchen aspirin EC 81 MG tablet Take 1 tablet (81 mg total) by mouth daily.  Marland Kitchen atorvastatin (LIPITOR) 40 MG tablet Take 1 tablet (40 mg total) by mouth daily at 6 PM.  . Cholecalciferol 50000 units capsule Take 1 capsule (50,000 Units total) by mouth once a week.  Marland Kitchen glipiZIDE-metformin (METAGLIP) 5-500 MG tablet 2 pills in am with food and 1 pill at night  . hydrochlorothiazide (HYDRODIURIL) 12.5 MG tablet TAKE 1 TABLET EVERY MORNING  . simvastatin (ZOCOR) 20 MG tablet Take 1 tablet (20 mg total) by mouth daily at 6 PM.  . telmisartan (MICARDIS) 40 MG tablet Take 1 tablet (40 mg total) by mouth daily.  . TRUE METRIX BLOOD GLUCOSE TEST test strip Bid E11.9  . TRUEPLUS LANCETS 28G MISC 3 Devices by Other route 3 (three) times daily.   No facility-administered encounter medications on file as of 09/05/2018.     Allergies (verified) Patient has no known allergies.   History: Past Medical History:  Diagnosis Date  . Arthritis    knees  . Chronic kidney disease   . Diabetes mellitus without complication (HCC)     . Hyperlipidemia   . Hypertension   . Obesity    Past Surgical History:  Procedure Laterality Date  . EYE SURGERY     b/l cataract 2004    Family History  Problem Relation Age of Onset  . Arthritis Sister   . Hypertension Sister   . Hyperlipidemia Sister   . Diabetes Son   . Mental illness Son        bipolar   . Hyperlipidemia Son   . Hypertension Son   . Arthritis Sister   . Hypertension Sister   . Hyperlipidemia Sister   . Arthritis Sister   . Hypertension Sister   . Hyperlipidemia Sister   . Arthritis Sister   . Hyperlipidemia Sister   . Hypertension Sister   . Cancer Mother        colon cancer   . Arthritis Mother   . Cancer Father        lung, smoker   Social History   Socioeconomic History  . Marital status: Unknown    Spouse name: Not on file  . Number of children: Not on file  . Years of education: Not on file  . Highest education level: Not on file  Occupational History  . Not on file  Social Needs  . Financial resource strain: Not hard at all  . Food insecurity:    Worry: Never  true    Inability: Never true  . Transportation needs:    Medical: No    Non-medical: No  Tobacco Use  . Smoking status: Never Smoker  . Smokeless tobacco: Never Used  Substance and Sexual Activity  . Alcohol use: No    Frequency: Never  . Drug use: No  . Sexual activity: Never    Partners: Male  Lifestyle  . Physical activity:    Days per week: 7 days    Minutes per session: 30 min  . Stress: Not at all  Relationships  . Social connections:    Talks on phone: Not on file    Gets together: Not on file    Attends religious service: Not on file    Active member of club or organization: Not on file    Attends meetings of clubs or organizations: Not on file    Relationship status: Not on file  Other Topics Concern  . Not on file  Social History Narrative   No etoh, drugs, never smoker    Walks her dog    No guns, wears seatbelt    Lives with son age 85 as  of 03/2018    Used to be Cytogeneticist     Tobacco Counseling Counseling given: Not Answered   Clinical Intake:  Pre-visit preparation completed: Yes  Pain : No/denies pain     Nutritional Status: BMI 25 -29 Overweight Diabetes: No  How often do you need to have someone help you when you read instructions, pamphlets, or other written materials from your doctor or pharmacy?: 1 - Never  Interpreter Needed?: No      Activities of Daily Living In your present state of health, do you have any difficulty performing the following activities: 09/05/2018  Hearing? N  Vision? N  Difficulty concentrating or making decisions? Y  Comment Difficulty remembering  Walking or climbing stairs? N  Dressing or bathing? N  Doing errands, shopping? Y  Preparing Food and eating ? N  Using the Toilet? N  In the past six months, have you accidently leaked urine? N  Do you have problems with loss of bowel control? N  Managing your Medications? N  Managing your Finances? N  Housekeeping or managing your Housekeeping? N  Some recent data might be hidden     Immunizations and Health Maintenance Immunization History  Administered Date(s) Administered  . Influenza, High Dose Seasonal PF 09/05/2018  . Pneumococcal Conjugate-13 12/26/2017   Health Maintenance Due  Topic Date Due  . FOOT EXAM  03/27/1959  . OPHTHALMOLOGY EXAM  03/27/1959  . TETANUS/TDAP  03/26/1968    Patient Care Team: McLean-Scocuzza, Pasty Spillers, MD as PCP - General (Internal Medicine)  Indicate any recent Medical Services you may have received from other than [facility] providers in the past year (date may be approximate).     Assessment:   This is a routine wellness examination for [facility].  The goal of the wellness visit is to assist the patient how to close the gaps in care and create a preventative care plan for the patient.   The roster of all physicians providing medical care to patient is listed in the Snapshot section  of the chart.  Taking calcium VIT D as appropriate/Osteoporosis risk reviewed.    Safety issues reviewed; She is the primary caretaker of her son who lives with her. Smoke and carbon monoxide detectors in the home. No firearms in the home. Wears seatbelts when riding with others. No  violence in the home.  They do not have excessive sun exposure.  Discussed the need for sun protection: hats, long sleeves and the use of sunscreen if there is significant sun exposure.  Patient is alert, normal appearance, oriented to person/place/and time.  Correctly identified the president of the [place] . Recalls of 0/3 words. Difficulty performing calculations.  Can read correct time from watch face.   No failures at ADL's or IADL's.    BMI- discussed the importance of a healthy diet, water intake and the benefits of aerobic exercise. Educational material provided.   24 hour diet recall: Diabetic diet.  Sleep patterns- Sleeps well at night.   High dose influenza administered L deltoid, tolerated well. Educational material provided.  TDAP vaccine deferred per patient preference.  Follow up with insurance.  Educational material provided.  Patient Concerns: None at this time. Follow up with PCP as needed.  Hearing/Vision screen  Visual Acuity Screening   Right eye Left eye Both eyes  Without correction:     With correction:   20/40  Comments: Cataracts extracted, bilateral Reading glasses  Hearing Screening Comments: Patient is able to hear conversational tones without difficulty.  No issues reported.   Dietary issues and exercise activities discussed: Current Exercise Habits: Home exercise routine, Type of exercise: walking, Time (Minutes): 30, Frequency (Times/Week): 7, Weekly Exercise (Minutes/Week): 210, Intensity: Moderate  Goals      Patient Stated   . Healthy Lifestyle (pt-stated)     Stay active Compliant with medications Drink plenty of fluids Eat healthy      Depression  Screen PHQ 2/9 Scores 09/05/2018 04/02/2018 12/26/2017  PHQ - 2 Score 0 0 0    Fall Risk Fall Risk  09/05/2018 04/02/2018 12/26/2017  Falls in the past year? 0 No No    Cognitive Function: MMSE - Mini Mental State Exam 09/05/2018  Orientation to time 5  Orientation to Place 5  Registration 3  Attention/ Calculation 2  Attention/Calculation-comments Difficulty with calculation  Recall 0  Recall-comments 0/3 recalled  Language- name 2 objects 2  Language- repeat 1  Language- follow 3 step command 3  Language- read & follow direction 1  Write a sentence 1  Copy design 1  Total score 24     6CIT Screen 09/05/2018  What Year? 0 points  What month? 0 points  What time? 0 points  Count back from 20 0 points  Months in reverse 4 points    Screening Tests Health Maintenance  Topic Date Due  . FOOT EXAM  03/27/1959  . OPHTHALMOLOGY EXAM  03/27/1959  . TETANUS/TDAP  03/26/1968  . MAMMOGRAM  12/27/2018 (Originally 03/27/1999)  . DEXA SCAN  12/27/2018 (Originally 03/26/2014)  . COLONOSCOPY  12/27/2018 (Originally 03/27/1999)  . Hepatitis C Screening  12/27/2018 (Originally 08-07-1951)  . HEMOGLOBIN A1C  10/02/2018  . PNA vac Low Risk Adult (2 of 2 - PPSV23) 12/27/2018  . INFLUENZA VACCINE  Completed     Plan:   End of life planning; Advanced aging; Advanced directives discussed.  No HCPOA/Living Will.  Additional information declined at this time.  I have personally reviewed and noted the following in the patient's chart:   . Medical and social history . Use of alcohol, tobacco or illicit drugs  . Current medications and supplements . Functional ability and status . Nutritional status . Physical activity . Advanced directives . List of other physicians . Hospitalizations, surgeries, and ER visits in previous 12 months . Vitals . Screenings to include  cognitive, depression, and falls . Referrals and appointments  In addition, I have reviewed and discussed with patient  certain preventive protocols, quality metrics, and best practice recommendations. A written personalized care plan for preventive services as well as general preventive health recommendations were provided to patient.     Ashok PallOBrien-Blaney, Yerik Zeringue L, LPN   16/10/960411/21/2019

## 2018-09-05 NOTE — Patient Instructions (Addendum)
Cut norvasc 10 mg in 1/2 pill=5 mg total daily  Consider mammogram for breast cancer screening   Mammogram A mammogram is an X-ray of the breasts that is done to check for changes that are not normal. This test can screen for and find any changes that may suggest breast cancer. This test can also help to find other changes and variations in the breast. What happens before the procedure?  Have this test done about 1-2 weeks after your period. This is usually when your breasts are the least tender.  If you are visiting a new doctor or clinic, send any past mammogram images to your new doctor's office.  Wash your breasts and under your arms the day of the test.  Do not use deodorants, perfumes, lotions, or powders on the day of the test.  Take off any jewelry from your neck.  Wear clothes that you can change into and out of easily. What happens during the procedure?  You will undress from the waist up. You will put on a gown.  You will stand in front of the X-ray machine.  Each breast will be placed between two plastic or glass plates. The plates will press down on your breast for a few seconds. Try to stay as relaxed as possible. This does not cause any harm to your breasts. Any discomfort you feel will be very brief.  X-rays will be taken from different angles of each breast. The procedure may vary among doctors and hospitals. What happens after the procedure?  The mammogram will be looked at by a specialist (radiologist).  You may need to do certain parts of the test again. This depends on the quality of the images.  Ask when your test results will be ready. Make sure you get your test results.  You may go back to your normal activities. This information is not intended to replace advice given to you by your health care provider. Make sure you discuss any questions you have with your health care provider. Document Released: 12/29/2008 Document Revised: 03/09/2016 Document  Reviewed: 12/11/2014 Elsevier Interactive Patient Education  [employer]2018 Elsevier Inc.

## 2018-09-05 NOTE — Progress Notes (Signed)
Pre visit review using our clinic review tool, if applicable. No additional management support is needed unless otherwise documented below in the visit note. 

## 2018-09-05 NOTE — Progress Notes (Signed)
Agree with below   TMS 

## 2018-09-06 LAB — VITAMIN D 25 HYDROXY (VIT D DEFICIENCY, FRACTURES): VITD: 81.98 ng/mL (ref 30.00–100.00)

## 2018-09-07 LAB — PROTEIN ELECTROPHORESIS, SERUM
A/G Ratio: 1.2 (ref 0.7–1.7)
ALPHA 2: 0.7 g/dL (ref 0.4–1.0)
Albumin ELP: 3.9 g/dL (ref 2.9–4.4)
Alpha 1: 0.2 g/dL (ref 0.0–0.4)
BETA: 1.1 g/dL (ref 0.7–1.3)
Gamma Globulin: 1.2 g/dL (ref 0.4–1.8)
Globulin, Total: 3.3 g/dL (ref 2.2–3.9)
Total Protein: 7.2 g/dL (ref 6.0–8.5)

## 2018-09-09 ENCOUNTER — Other Ambulatory Visit: Payer: Self-pay | Admitting: Internal Medicine

## 2018-09-09 DIAGNOSIS — E559 Vitamin D deficiency, unspecified: Secondary | ICD-10-CM

## 2018-09-09 DIAGNOSIS — N179 Acute kidney failure, unspecified: Secondary | ICD-10-CM

## 2018-09-09 DIAGNOSIS — N189 Chronic kidney disease, unspecified: Principal | ICD-10-CM

## 2018-09-09 NOTE — Telephone Encounter (Signed)
Copied from CRM [id number]. Topic: Quick Communication - Rx Refill/Question >> Sep 09, 2018  9:27 AM Windy KalataMichael, Addasyn Mcbreen L, NT wrote: Medication: Cholecalciferol 50000 units capsule  Has the patient contacted their pharmacy? Yes.  Pharmacy states they have not heard anything back from the office.   Preferred Pharmacy (with phone number or street name): [facility] [address] - [place] - [address] [place] [id number] Phone: [phone] Fax: [phone]    Agent: Please be advised that RX refills may take up to 3 business days. We ask that you follow-up with your pharmacy.

## 2018-09-10 MED ORDER — CHOLECALCIFEROL 1.25 MG (50000 UT) PO CAPS: 50000.0000 [IU] | ORAL_CAPSULE | ORAL | 1 refills | Status: DC

## 2018-09-10 NOTE — Telephone Encounter (Signed)
Requested medication (s) are due for refill today: yes  Requested medication (s) are on the active medication list: yes    Last refill: 04/02/18    Future visit scheduled yes 09/24/18  LAB only  Notes to clinic:not delegated  Requested Prescriptions  Pending Prescriptions Disp Refills   Cholecalciferol 1.25 MG (50000 UT) capsule 13 capsule 1    Sig: Take 1 capsule (50,000 Units total) by mouth once a week.     Endocrinology:  Vitamins - Vitamin D Supplementation Failed - 09/09/2018  1:05 PM      Failed - 50,000 IU strengths are not delegated      Failed - Phosphate in normal range and within 360 days    No results found for: PHOS       Failed - Vit D in normal range and within 360 days    VITD  Date Value Ref Range Status  09/05/2018 81.98 30.00 - 100.00 ng/mL Final         Passed - Ca in normal range and within 360 days    Calcium  Date Value Ref Range Status  09/05/2018 10.2 8.4 - 10.5 mg/dL Final         Passed - Valid encounter within last 12 months    Recent Outpatient Visits          5 days ago Essential hypertension   [facility] [place] [place] McLean-Scocuzza, Pasty Spillersracy N, MD   5 months ago Essential hypertension   [facility] [place] McLean-Scocuzza, Pasty Spillersracy N, MD   8 months ago Essential hypertension   [facility] [place] McLean-Scocuzza, Pasty Spillersracy N, MD      Future Appointments            In 5 months McLean-Scocuzza, Pasty Spillersracy N, MD [facility] [place]   In 12 months O'Brien-Blaney, Denisa L, LPN [facility] [place]   In 12 months McLean-Scocuzza, Pasty Spillersracy N, MD [facility] [place]

## 2018-09-12 LAB — PROTEIN ELECTROPHORESIS, URINE REFLEX: Protein, Ur: 76.4 mg/dL

## 2018-09-12 LAB — MICROALBUMIN / CREATININE URINE RATIO
CREATININE, UR: 518.4 mg/dL
Microalb/Creat Ratio: 7.8 mg/g creat (ref 0.0–30.0)
Microalbumin, Urine: 40.4 ug/mL

## 2018-09-24 ENCOUNTER — Other Ambulatory Visit: Payer: Medicare HMO

## 2018-09-27 ENCOUNTER — Other Ambulatory Visit: Payer: Medicare HMO

## 2018-10-21 ENCOUNTER — Other Ambulatory Visit (INDEPENDENT_AMBULATORY_CARE_PROVIDER_SITE_OTHER): Payer: Medicare HMO

## 2018-10-21 DIAGNOSIS — N179 Acute kidney failure, unspecified: Secondary | ICD-10-CM

## 2018-10-21 DIAGNOSIS — N189 Chronic kidney disease, unspecified: Secondary | ICD-10-CM

## 2018-10-21 LAB — BASIC METABOLIC PANEL
BUN: 17 mg/dL (ref 6–23)
CO2: 30 mEq/L (ref 19–32)
Calcium: 9.8 mg/dL (ref 8.4–10.5)
Chloride: 102 mEq/L (ref 96–112)
Creatinine, Ser: 1.3 mg/dL — ABNORMAL HIGH (ref 0.40–1.20)
GFR: 52.14 mL/min — ABNORMAL LOW (ref >60.00)
Glucose, Bld: 86 mg/dL (ref 70–99)
Potassium: 4.3 mEq/L (ref 3.5–5.1)
Sodium: 140 mEq/L (ref 135–145)

## 2019-01-23 ENCOUNTER — Telehealth: Payer: Self-pay | Admitting: Internal Medicine

## 2019-01-23 NOTE — Telephone Encounter (Signed)
Unable to leave message for patient to return call back. [facility] request information.  Need to know what medication and what patient is talking about. Her medication has not been refilled since 04/2018

## 2019-01-23 NOTE — Telephone Encounter (Signed)
Pt called and left a voicemail very upset about her medication being denied with [facility] pharmacy. Please advice.

## 2019-01-30 ENCOUNTER — Telehealth: Payer: Self-pay | Admitting: Internal Medicine

## 2019-01-30 DIAGNOSIS — E785 Hyperlipidemia, unspecified: Secondary | ICD-10-CM

## 2019-01-30 DIAGNOSIS — I10 Essential (primary) hypertension: Secondary | ICD-10-CM

## 2019-01-30 MED ORDER — ATORVASTATIN CALCIUM 40 MG PO TABS: 40.0000 mg | ORAL_TABLET | Freq: Every day | ORAL | 3 refills | Status: DC

## 2019-01-30 MED ORDER — AMLODIPINE BESYLATE 10 MG PO TABS: 5.0000 mg | ORAL_TABLET | Freq: Every day | ORAL | 3 refills | Status: DC

## 2019-01-30 MED ORDER — TELMISARTAN 40 MG PO TABS: 40.0000 mg | ORAL_TABLET | Freq: Every day | ORAL | 3 refills | Status: DC

## 2019-01-30 NOTE — Telephone Encounter (Signed)
Copied from CRM [id number]. Topic: Quick Communication - Rx Refill/Question >> Jan 30, 2019  1:17 PM Sedonia Small, [place] A wrote: Medication: atorvastatin (LIPITOR) 40 MG tablet ,amLODipine (NORVASC) 10 MG tablet,telmisartan (MICARDIS) 40 MG tablet   Has the patient contacted their pharmacy? Yes (Agent: If no, request that the patient contact the pharmacy for the refill.) (Agent: If yes, when and what did the pharmacy advise?)Contact PCP  Preferred Pharmacy (with phone number or street name): [facility] - [place], [place] - [address] [phone] (Phone) [phone] (Fax)    Agent: Please be advised that RX refills may take up to 3 business days. We ask that you follow-up with your pharmacy.

## 2019-03-04 ENCOUNTER — Other Ambulatory Visit: Payer: Self-pay | Admitting: Internal Medicine

## 2019-03-04 DIAGNOSIS — I10 Essential (primary) hypertension: Secondary | ICD-10-CM

## 2019-03-04 DIAGNOSIS — E785 Hyperlipidemia, unspecified: Secondary | ICD-10-CM

## 2019-03-04 MED ORDER — TELMISARTAN 40 MG PO TABS: 40.0000 mg | ORAL_TABLET | Freq: Every day | ORAL | 3 refills | Status: DC

## 2019-03-04 MED ORDER — ATORVASTATIN CALCIUM 40 MG PO TABS: 40.0000 mg | ORAL_TABLET | Freq: Every day | ORAL | 3 refills | Status: DC

## 2019-03-06 ENCOUNTER — Ambulatory Visit: Payer: Medicare HMO | Admitting: Internal Medicine

## 2019-03-12 ENCOUNTER — Other Ambulatory Visit: Payer: Self-pay | Admitting: Internal Medicine

## 2019-03-12 DIAGNOSIS — I10 Essential (primary) hypertension: Secondary | ICD-10-CM

## 2019-03-12 MED ORDER — AMLODIPINE BESYLATE 5 MG PO TABS: 5.0000 mg | ORAL_TABLET | Freq: Every day | ORAL | 3 refills | Status: DC

## 2019-03-19 ENCOUNTER — Other Ambulatory Visit: Payer: Self-pay | Admitting: Internal Medicine

## 2019-03-19 ENCOUNTER — Telehealth: Payer: Self-pay | Admitting: *Deleted

## 2019-03-19 DIAGNOSIS — E1122 Type 2 diabetes mellitus with diabetic chronic kidney disease: Secondary | ICD-10-CM

## 2019-03-19 MED ORDER — GLIPIZIDE-METFORMIN HCL 5-500 MG PO TABS: ORAL_TABLET | ORAL | 3 refills | Status: DC

## 2019-03-19 NOTE — Telephone Encounter (Signed)
Copied from CRM [id number]. Topic: General - Other >> Mar 19, 2019 11:21 AM Darron Doom wrote: Reason for CRM: Patient called to say taht she received a letter from [facility] and it states that will not pay for her glipiZIDE-metformin (METAGLIP) 5-500 MG tablet because they can't reach anyone at the office. Ph# [phone]

## 2019-04-11 ENCOUNTER — Other Ambulatory Visit: Payer: Self-pay

## 2019-04-11 ENCOUNTER — Ambulatory Visit (INDEPENDENT_AMBULATORY_CARE_PROVIDER_SITE_OTHER): Payer: Medicare HMO | Admitting: Internal Medicine

## 2019-04-11 DIAGNOSIS — Z1329 Encounter for screening for other suspected endocrine disorder: Secondary | ICD-10-CM | POA: Diagnosis not present

## 2019-04-11 DIAGNOSIS — Z1389 Encounter for screening for other disorder: Secondary | ICD-10-CM | POA: Diagnosis not present

## 2019-04-11 DIAGNOSIS — E1122 Type 2 diabetes mellitus with diabetic chronic kidney disease: Secondary | ICD-10-CM

## 2019-04-11 DIAGNOSIS — I10 Essential (primary) hypertension: Secondary | ICD-10-CM

## 2019-04-11 MED ORDER — TRUE METRIX BLOOD GLUCOSE TEST VI STRP: ORAL_STRIP | 3 refills | Status: DC

## 2019-04-11 NOTE — Progress Notes (Signed)
Telephone Note  I connected with Ebony Reese  on 04/11/19 at  4:15 PM EDT by telephone and verified that I am speaking with the correct person using two identifiers.  Location patient: home Location provider:work  Persons participating in the virtual visit: patient, provider  I discussed the limitations of evaluation and management by telemedicine and the availability of in person appointments. The patient expressed understanding and agreed to proceed.   HPI: 1. HTN controlled on norvasc 5 mg and micardis 40 mg qd  2. DM 2 last A1C 6.9 on Metaglip 5-500 2 pills in am and 1 pill qhs  She needs refill of strips checking cbg 2 x per day cbg running 110 She is walking her dog 4-5 x per day 82 y.o dog in the yard    ROS: See pertinent positives and negatives per HPI. General: she reports her wt is up  Past Medical History:  Diagnosis Date  . Arthritis    knees  . Chronic kidney disease   . Diabetes mellitus without complication ([place])   . Hyperlipidemia   . Hypertension   . Obesity     Past Surgical History:  Procedure Laterality Date  . EYE SURGERY     b/l cataract 2004     Family History  Problem Relation Age of Onset  . Arthritis Sister   . Hypertension Sister   . Hyperlipidemia Sister   . Diabetes Son   . Mental illness Son        bipolar   . Hyperlipidemia Son   . Hypertension Son   . Arthritis Sister   . Hypertension Sister   . Hyperlipidemia Sister   . Arthritis Sister   . Hypertension Sister   . Hyperlipidemia Sister   . Arthritis Sister   . Hyperlipidemia Sister   . Hypertension Sister   . Cancer Mother        colon cancer   . Arthritis Mother   . Cancer Father        lung, smoker    SOCIAL HX: lives with son    Current Outpatient Medications:  .  amLODipine (NORVASC) 5 MG tablet, Take 1 tablet (5 mg total) by mouth daily. Do not cut in half, Disp: 90 tablet, Rfl: 3 .  aspirin EC 81 MG tablet, Take 1 tablet (81 mg total) by mouth daily.,  Disp: 90 tablet, Rfl: 3 .  atorvastatin (LIPITOR) 40 MG tablet, Take 1 tablet (40 mg total) by mouth daily at 6 PM., Disp: 90 tablet, Rfl: 3 .  Cholecalciferol 1.25 MG (50000 UT) capsule, Take 1 capsule (50,000 Units total) by mouth once a week., Disp: 13 capsule, Rfl: 1 .  glipiZIDE-metformin (METAGLIP) 5-500 MG tablet, 2 pills in am with food and 1 pill at night, Disp: 360 tablet, Rfl: 3 .  telmisartan (MICARDIS) 40 MG tablet, Take 1 tablet (40 mg total) by mouth daily., Disp: 90 tablet, Rfl: 3 .  TRUE METRIX BLOOD GLUCOSE TEST test strip, Bid E11.9, Disp: 300 each, Rfl: 3 .  TRUEPLUS LANCETS 28G MISC, 3 Devices by Other route 3 (three) times daily., Disp: 300 each, Rfl: 11  EXAM:  VITALS per patient if applicable:  GENERAL: alert, oriented, appears well and in no acute distress  HEENT: atraumatic, conjunttiva clear, no obvious abnormalities on inspection of external nose and ears  NECK: normal movements of the head and neck  LUNGS: on inspection no signs of respiratory distress, breathing rate appears normal, no obvious gross SOB, gasping or  wheezing  CV: no obvious cyanosis  MS: moves all visible extremities without noticeable abnormality  PSYCH/NEURO: pleasant and cooperative, no obvious depression or anxiety, speech and thought processing grossly intact  ASSESSMENT AND PLAN:  Discussed the following assessment and plan:  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 The patient was provided an opportunity to ask questions and all were answered. The patient agreed with the plan and demonstrated an understanding of the instructions.   The patient was advised to call back or seek an in-person evaluation if the symptoms worsen or if the condition fails to improve as anticipated.  Time spent 15 minutes  Bevelyn Bucklesracy N McLean-Scocuzza, MD

## 2019-06-27 ENCOUNTER — Other Ambulatory Visit: Payer: Medicare HMO

## 2019-07-12 ENCOUNTER — Other Ambulatory Visit: Payer: Self-pay

## 2019-07-12 ENCOUNTER — Ambulatory Visit (INDEPENDENT_AMBULATORY_CARE_PROVIDER_SITE_OTHER): Payer: Medicare HMO

## 2019-07-12 DIAGNOSIS — Z23 Encounter for immunization: Secondary | ICD-10-CM

## 2019-07-17 ENCOUNTER — Telehealth: Payer: Self-pay | Admitting: Internal Medicine

## 2019-07-17 NOTE — Telephone Encounter (Signed)
She can just take otc D3 2000 Iu daily  Last time checked vitamin D was back to normal   Ebony Reese

## 2019-07-17 NOTE — Telephone Encounter (Signed)
Patient calling to request refill of "vitamin". Patient does not know the name but states she needs a refill. Patient also does not have the bottle. Please advise?

## 2019-07-17 NOTE — Telephone Encounter (Signed)
Only vitamin is vitamin D 50k units

## 2019-07-18 NOTE — Telephone Encounter (Signed)
Left message for patient to return call back. PEC may give information.  

## 2019-07-21 NOTE — Telephone Encounter (Signed)
Pt was given message from Dr Linus Orn as written. Pt verbalized understanding.

## 2019-07-25 ENCOUNTER — Other Ambulatory Visit: Payer: Self-pay

## 2019-07-25 ENCOUNTER — Other Ambulatory Visit (INDEPENDENT_AMBULATORY_CARE_PROVIDER_SITE_OTHER): Payer: Medicare HMO

## 2019-07-25 DIAGNOSIS — E1122 Type 2 diabetes mellitus with diabetic chronic kidney disease: Secondary | ICD-10-CM | POA: Diagnosis not present

## 2019-07-25 DIAGNOSIS — I10 Essential (primary) hypertension: Secondary | ICD-10-CM | POA: Diagnosis not present

## 2019-07-25 DIAGNOSIS — Z1389 Encounter for screening for other disorder: Secondary | ICD-10-CM

## 2019-07-25 DIAGNOSIS — Z1329 Encounter for screening for other suspected endocrine disorder: Secondary | ICD-10-CM

## 2019-07-25 LAB — LIPID PANEL
Cholesterol: 130 mg/dL (ref 0–200)
HDL: 54 mg/dL (ref >39.00)
LDL Cholesterol: 61 mg/dL (ref 0–99)
NonHDL: 75.79
Total CHOL/HDL Ratio: 2
Triglycerides: 72 mg/dL (ref 0.0–149.0)
VLDL: 14.4 mg/dL (ref 0.0–40.0)

## 2019-07-25 LAB — URINALYSIS, ROUTINE W REFLEX MICROSCOPIC
Bilirubin Urine: NEGATIVE
Glucose, UA: NEGATIVE
Hgb urine dipstick: NEGATIVE
Hyaline Cast: NONE SEEN /LPF
Nitrite: NEGATIVE
Specific Gravity, Urine: 1.032 (ref 1.001–1.03)
Squamous Epithelial / HPF: >=28 /HPF — AB (ref <=5)
WBC, UA: >=60 /HPF — AB (ref 0–5)
pH: <=5 (ref 5.0–8.0)

## 2019-07-25 LAB — CBC WITH DIFFERENTIAL/PLATELET
Basophils Absolute: 0 10*3/uL (ref 0.0–0.1)
Basophils Relative: 0.5 % (ref 0.0–3.0)
Eosinophils Absolute: 0.1 10*3/uL (ref 0.0–0.7)
Eosinophils Relative: 1.3 % (ref 0.0–5.0)
HCT: 34.5 % — ABNORMAL LOW (ref 36.0–46.0)
Hemoglobin: 11.2 g/dL — ABNORMAL LOW (ref 12.0–15.0)
Lymphocytes Relative: 21.6 % (ref 12.0–46.0)
Lymphs Abs: 1.6 10*3/uL (ref 0.7–4.0)
MCHC: 32.5 g/dL (ref 30.0–36.0)
MCV: 91.1 fl (ref 78.0–100.0)
Monocytes Absolute: 0.5 10*3/uL (ref 0.1–1.0)
Monocytes Relative: 6.5 % (ref 3.0–12.0)
Neutro Abs: 5.2 10*3/uL (ref 1.4–7.7)
Neutrophils Relative %: 70.1 % (ref 43.0–77.0)
Platelets: 265 10*3/uL (ref 150.0–400.0)
RBC: 3.79 Mil/uL — ABNORMAL LOW (ref 3.87–5.11)
RDW: 14.8 % (ref 11.5–15.5)
WBC: 7.4 10*3/uL (ref 4.0–10.5)

## 2019-07-25 LAB — COMPREHENSIVE METABOLIC PANEL
ALT: 9 U/L (ref 0–35)
AST: 16 U/L (ref 0–37)
Albumin: 4.2 g/dL (ref 3.5–5.2)
Alkaline Phosphatase: 54 U/L (ref 39–117)
BUN: 20 mg/dL (ref 6–23)
CO2: 31 mEq/L (ref 19–32)
Calcium: 10 mg/dL (ref 8.4–10.5)
Chloride: 101 mEq/L (ref 96–112)
Creatinine, Ser: 1.41 mg/dL — ABNORMAL HIGH (ref 0.40–1.20)
GFR: 44.57 mL/min — ABNORMAL LOW (ref >60.00)
Glucose, Bld: 90 mg/dL (ref 70–99)
Potassium: 3.8 mEq/L (ref 3.5–5.1)
Sodium: 141 mEq/L (ref 135–145)
Total Bilirubin: 0.7 mg/dL (ref 0.2–1.2)
Total Protein: 7 g/dL (ref 6.0–8.3)

## 2019-07-25 LAB — TSH: TSH: 1.8 u[IU]/mL (ref 0.35–4.50)

## 2019-07-25 LAB — HEMOGLOBIN A1C: Hgb A1c MFr Bld: 6.4 % (ref 4.6–6.5)

## 2019-09-09 ENCOUNTER — Encounter: Payer: Self-pay | Admitting: Internal Medicine

## 2019-09-09 ENCOUNTER — Ambulatory Visit (INDEPENDENT_AMBULATORY_CARE_PROVIDER_SITE_OTHER): Payer: Medicare HMO | Admitting: Internal Medicine

## 2019-09-09 ENCOUNTER — Ambulatory Visit (INDEPENDENT_AMBULATORY_CARE_PROVIDER_SITE_OTHER): Payer: Medicare HMO

## 2019-09-09 ENCOUNTER — Other Ambulatory Visit: Payer: Self-pay

## 2019-09-09 VITALS — Ht 65.0 in | Wt 180.0 lb

## 2019-09-09 DIAGNOSIS — Z0000 Encounter for general adult medical examination without abnormal findings: Secondary | ICD-10-CM

## 2019-09-09 DIAGNOSIS — I10 Essential (primary) hypertension: Secondary | ICD-10-CM

## 2019-09-09 DIAGNOSIS — E1122 Type 2 diabetes mellitus with diabetic chronic kidney disease: Secondary | ICD-10-CM | POA: Diagnosis not present

## 2019-09-09 DIAGNOSIS — N1831 Chronic kidney disease, stage 3a: Secondary | ICD-10-CM

## 2019-09-09 NOTE — Patient Instructions (Addendum)
  Ebony Reese , Thank you for taking time to come for your Medicare Wellness Visit. I appreciate your ongoing commitment to your health goals. Please review the following plan we discussed and let me know if I can assist you in the future.   These are the goals we discussed: Goals      Patient Stated   . Healthy Lifestyle (pt-stated)     Stay active Compliant with medications Drink plenty of fluids Eat healthy       This is a list of the screening recommended for you and due dates:  Health Maintenance  Topic Date Due  . Eye exam for diabetics  03/27/1959  . Tetanus Vaccine  03/26/1968  . Pneumonia vaccines (2 of 2 - PPSV23) 12/27/2018  . Complete foot exam   09/06/2019  . Mammogram  04/10/2020*  . DEXA scan (bone density measurement)  04/10/2020*  . Colon Cancer Screening  04/10/2020*  .  Hepatitis C: One time screening is recommended by Center for Disease Control  (CDC) for  adults born from [birth year] through 1967.   04/10/2020*  . Hemoglobin A1C  01/23/2020  . Flu Shot  Completed  *Topic was postponed. The date shown is not the original due date.

## 2019-09-09 NOTE — Progress Notes (Signed)
telephone Note  I connected with Ebony Reese   on 09/09/19 at 11:00 AM EST by a telephone and verified that I am speaking with the correct person using two identifiers.  Location patient: home Location provider:work or home office Persons participating in the virtual visit: patient, provider  I discussed the limitations of evaluation and management by telemedicine and the availability of in person appointments. The patient expressed understanding and agreed to proceed.   HPI: 1. HTN not checking BP on norvasc 5 mg qd and micardis 40 mg qd prev diuretic caused AKI  Reviewed labs 07/25/2019  2. Dm 2 A1C 6.4 on mitacip 5-500 2 pills in am and 1 pill qhs cbg 109 today before food    ROS: See pertinent positives and negatives per HPI.  Past Medical History:  Diagnosis Date  . Arthritis    knees  . Chronic kidney disease   . Diabetes mellitus without complication ([facility])   . Hyperlipidemia   . Hypertension   . Obesity     Past Surgical History:  Procedure Laterality Date  . EYE SURGERY     b/l cataract 2004     Family History  Problem Relation Age of Onset  . Arthritis Sister   . Hypertension Sister   . Hyperlipidemia Sister   . Diabetes Son   . Mental illness Son        bipolar/cognitive deficit   . Hyperlipidemia Son   . Hypertension Son   . Arthritis Sister   . Hypertension Sister   . Hyperlipidemia Sister   . Arthritis Sister   . Hypertension Sister   . Hyperlipidemia Sister   . Arthritis Sister   . Hyperlipidemia Sister   . Hypertension Sister   . Cancer Mother        colon cancer   . Arthritis Mother   . Cancer Father        lung, smoker    SOCIAL HX: lives with 1 son 41 y.o    Current Outpatient Medications:  .  amLODipine (NORVASC) 5 MG tablet, Take 1 tablet (5 mg total) by mouth daily. Do not cut in half, Disp: 90 tablet, Rfl: 3 .  aspirin EC 81 MG tablet, Take 1 tablet (81 mg total) by mouth daily., Disp: 90 tablet, Rfl: 3 .  atorvastatin  (LIPITOR) 40 MG tablet, Take 1 tablet (40 mg total) by mouth daily at 6 PM., Disp: 90 tablet, Rfl: 3 .  Cholecalciferol 1.25 MG (50000 UT) capsule, Take 1 capsule (50,000 Units total) by mouth once a week., Disp: 13 capsule, Rfl: 1 .  glipiZIDE-metformin (METAGLIP) 5-500 MG tablet, 2 pills in am with food and 1 pill at night, Disp: 360 tablet, Rfl: 3 .  telmisartan (MICARDIS) 40 MG tablet, Take 1 tablet (40 mg total) by mouth daily., Disp: 90 tablet, Rfl: 3 .  TRUE METRIX BLOOD GLUCOSE TEST test strip, Bid E11.9, Disp: 300 each, Rfl: 3 .  TRUEPLUS LANCETS 28G MISC, 3 Devices by Other route 3 (three) times daily., Disp: 300 each, Rfl: 11  EXAM:  VITALS per patient if applicable:  GENERAL: alert, oriented, appears well and in no acute distress  PSYCH/NEURO: pleasant and cooperative, no obvious depression or anxiety, speech and thought processing grossly intact  ASSESSMENT AND PLAN:  Discussed the following assessment and plan:  Type 2 diabetes mellitus with chronic kidney disease, without long-term current use of insulin, unspecified CKD stage (HCC) A1C 6.4 07/25/2019   Essential hypertension -cont meds adequate hydration rec  Stage 3a chronic kidney disease -see above monitor renal function   HM Flu shot utd  prevnarhad, pna 23 due 3/2020pt declines 09/09/19  Tdap disc'edwill give Rx at f/udeclines for now as of 09/09/19  Declines to be tested hep B/C rec D3 2000 IU qd   Declines mammo, pap, colonoscopy, cologaurd(mother died of colon cancer not candidate)consider fobt stool tests in future(pt to have through insurance mail order and will fax me result of fobt) disc DEXA pt declines prev   Declines all health maintenance as of 09/09/19 and prior to this as well   -have discussed with patient multiple times   -we discussed possible serious and likely etiologies, options for evaluation and workup, limitations of telemedicine visit vs in person visit, treatment,  treatment risks and precautions. Pt prefers to treat via telemedicine empirically rather then risking or undertaking an in person visit at this moment. Patient agrees to seek prompt in person care if worsening, new symptoms arise, or if is not improving with treatment.   I discussed the assessment and treatment plan with the patient. The patient was provided an opportunity to ask questions and all were answered. The patient agreed with the plan and demonstrated an understanding of the instructions.   The patient was advised to call back or seek an in-person evaluation if the symptoms worsen or if the condition fails to improve as anticipated.  Time spent 15 minutes  Delorise Jackson, MD

## 2019-09-09 NOTE — Progress Notes (Addendum)
Subjective:   Ebony Reese is a 68 y.o. female who presents for Medicare Annual (Subsequent) preventive examination.  Review of Systems:  No ROS.  Medicare Wellness Virtual Visit.  Visual/audio telehealth visit, UTA vital signs.   See social history for additional risk factors.   Cardiac Risk Factors include: advanced age (>2255men, 93>65 women)     Objective:     Vitals: There were no vitals taken for this visit.  There is no height or weight on file to calculate BMI.  Advanced Directives 09/09/2019 09/05/2018  Does Patient Have a Medical Advance Directive? No No  Would patient like information on creating a medical advance directive? No - Patient declined No - Patient declined    Tobacco Social History   Tobacco Use  Smoking Status Never Smoker  Smokeless Tobacco Never Used     Counseling given: Not Answered   Clinical Intake:                       Past Medical History:  Diagnosis Date  . Arthritis    knees  . Chronic kidney disease   . Diabetes mellitus without complication ([facility])   . Hyperlipidemia   . Hypertension   . Obesity    Past Surgical History:  Procedure Laterality Date  . EYE SURGERY     b/l cataract 2004    Family History  Problem Relation Age of Onset  . Arthritis Sister   . Hypertension Sister   . Hyperlipidemia Sister   . Diabetes Son   . Mental illness Son        bipolar   . Hyperlipidemia Son   . Hypertension Son   . Arthritis Sister   . Hypertension Sister   . Hyperlipidemia Sister   . Arthritis Sister   . Hypertension Sister   . Hyperlipidemia Sister   . Arthritis Sister   . Hyperlipidemia Sister   . Hypertension Sister   . Cancer Mother        colon cancer   . Arthritis Mother   . Cancer Father        lung, smoker   Social History   Socioeconomic History  . Marital status: Unknown    Spouse name: Not on file  . Number of children: Not on file  . Years of education: Not on file  . Highest education  level: Not on file  Occupational History  . Not on file  Social Needs  . Financial resource strain: Not hard at all  . Food insecurity    Worry: Never true    Inability: Never true  . Transportation needs    Medical: No    Non-medical: No  Tobacco Use  . Smoking status: Never Smoker  . Smokeless tobacco: Never Used  Substance and Sexual Activity  . Alcohol use: No    Frequency: Never  . Drug use: No  . Sexual activity: Never    Partners: Male  Lifestyle  . Physical activity    Days per week: 7 days    Minutes per session: 30 min  . Stress: Not at all  Relationships  . Social Musicianconnections    Talks on phone: Not on file    Gets together: Not on file    Attends religious service: Not on file    Active member of club or organization: Not on file    Attends meetings of clubs or organizations: Not on file    Relationship status: Not on file  Other Topics Concern  . Not on file  Social History Narrative   No etoh, drugs, never smoker    Walks her dog    No guns, wears seatbelt    Lives with son age 76 as of 03/2018 had her son age 42   Used to be Psychologist, clinical     Outpatient Encounter Medications as of 09/09/2019  Medication Sig  . amLODipine (NORVASC) 5 MG tablet Take 1 tablet (5 mg total) by mouth daily. Do not cut in half  . aspirin EC 81 MG tablet Take 1 tablet (81 mg total) by mouth daily.  Marland Kitchen atorvastatin (LIPITOR) 40 MG tablet Take 1 tablet (40 mg total) by mouth daily at 6 PM.  . Cholecalciferol 1.25 MG (50000 UT) capsule Take 1 capsule (50,000 Units total) by mouth once a week.  Marland Kitchen glipiZIDE-metformin (METAGLIP) 5-500 MG tablet 2 pills in am with food and 1 pill at night  . telmisartan (MICARDIS) 40 MG tablet Take 1 tablet (40 mg total) by mouth daily.  . TRUE METRIX BLOOD GLUCOSE TEST test strip Bid E11.9  . TRUEPLUS LANCETS 28G MISC 3 Devices by Other route 3 (three) times daily.   No facility-administered encounter medications on file as of 09/09/2019.      Activities of Daily Living In your present state of health, do you have any difficulty performing the following activities: 09/09/2019  Hearing? N  Vision? N  Difficulty concentrating or making decisions? N  Walking or climbing stairs? N  Dressing or bathing? N  Doing errands, shopping? Y  Preparing Food and eating ? N  Using the Toilet? N  In the past six months, have you accidently leaked urine? N  Do you have problems with loss of bowel control? N  Managing your Medications? N  Managing your Finances? N  Housekeeping or managing your Housekeeping? N  Some recent data might be hidden    Patient Care Team: McLean-Scocuzza, Nino Glow, MD as PCP - General (Internal Medicine)    Assessment:   This is a routine wellness examination for [name].  Nurse connected with patient 09/09/19 at 10:30 AM EST by a telephone enabled telemedicine application and verified that I am speaking with the correct person using two identifiers. Patient stated full name and DOB. Patient gave permission to continue with virtual visit. Patient's location was at home and Nurse's location was at [place] office.   Health Maintenance Due: -PNA - discussed; to be completed with doctor in visit or local pharmacy.  -Tdap- discussed; to be completed with doctor in visit or local pharmacy.   -Eye Exam- she plans to schedule -Hgb A1c- 07/25/19 (6.4) Update all pending maintenance due as appropriate.   See completed HM at the end of note.   Eye: Visual acuity not assessed. Virtual visit. Wears corrective lenses when reading. Followed by their ophthalmologist. Retinopathy- none reported.  Dental: UTD  Hearing: Demonstrates normal hearing during visit.  Safety:  Patient feels safe at home- yes Patient does have smoke detectors at home- yes Patient does wear sunscreen or protective clothing when in direct sunlight - yes Patient does wear seat belt when in a moving vehicle - yes Patient drives- no Adequate  lighting in walkways free from debris- yes Grab bars and handrails used as appropriate- yes Ambulates with yes assistive device Cell phone on person when ambulating outside of the home- yes  Social: Alcohol intake - no      Smoking history- never   Smokers in home?  none Illicit drug use? none  Depression: PHQ 2 &9 complete. See screening below. Denies irritability, anhedonia, sadness/tearfullness.    Falls: See screening below.    Medication: Taking as directed and without issues.   Covid-19: Precautions and sickness symptoms discussed. Wears mask, social distancing, hand hygiene as appropriate.   Activities of Daily Living Patient denies needing assistance with: household chores, feeding themselves, getting from bed to chair, getting to the toilet, bathing/showering, dressing, managing money, or preparing meals.   Memory: Patient is alert. Patient denies difficulty focusing or concentrating. Correctly identified the president of the [place] and season. Patient likes to read the newspaper for brain stimulation.   BMI- discussed the importance of a healthy diet, water intake and the benefits of aerobic exercise.  Educational material provided.  Physical activity- walking 2-3 times a week 20 minutes, yard work  Diet:  Low carb Water: good intake Caffeine: 1 cup of coffee  Other Providers Patient Care Team: McLean-Scocuzza, Pasty Spillers, MD as PCP - General (Internal Medicine)  Exercise Activities and Dietary recommendations Current Exercise Habits: Home exercise routine, Type of exercise: walking, Intensity: Mild  Goals      Patient Stated   . Healthy Lifestyle (pt-stated)     Stay active Compliant with medications Drink plenty of fluids Eat healthy       Fall Risk Fall Risk  04/11/2019 09/05/2018 04/02/2018 12/26/2017  Falls in the past year? 0 0 No No  Number falls in past yr: 0 - - -  Injury with Fall? 0 - - -  Follow up Falls evaluation completed - - -   Timed  Get Up and Go performed: no, virtual visit.  Depression Screen PHQ 2/9 Scores 04/11/2019 09/05/2018 04/02/2018 12/26/2017  PHQ - 2 Score 0 0 0 0     Cognitive Function MMSE - Mini Mental State Exam 09/05/2018  Orientation to time 5  Orientation to Place 5  Registration 3  Attention/ Calculation 2  Attention/Calculation-comments Difficulty with calculation  Recall 0  Recall-comments 0/3 recalled  Language- name 2 objects 2  Language- repeat 1  Language- follow 3 step command 3  Language- read & follow direction 1  Write a sentence 1  Copy design 1  Total score 24     6CIT Screen 09/09/2019 09/05/2018  What Year? 0 points 0 points  What month? 0 points 0 points  What time? 0 points 0 points  Count back from 20 0 points 0 points  Months in reverse 2 points 4 points  Repeat phrase 2 points -  Total Score 4 -    Immunization History  Administered Date(s) Administered  . Fluad Quad(high Dose 65+) 07/12/2019  . Influenza, High Dose Seasonal PF 09/05/2018  . Pneumococcal Conjugate-13 12/26/2017   Screening Tests Health Maintenance  Topic Date Due  . OPHTHALMOLOGY EXAM  03/27/1959  . TETANUS/TDAP  03/26/1968  . PNA vac Low Risk Adult (2 of 2 - PPSV23) 12/27/2018  . FOOT EXAM  09/06/2019  . MAMMOGRAM  04/10/2020 (Originally 03/27/1999)  . DEXA SCAN  04/10/2020 (Originally 03/26/2014)  . COLONOSCOPY  04/10/2020 (Originally 03/27/1999)  . Hepatitis C Screening  04/10/2020 (Originally 1950/11/03)  . HEMOGLOBIN A1C  01/23/2020  . INFLUENZA VACCINE  Completed      Plan:   Keep all routine maintenance appointments.   Follow up with your doctor today @ 11:00.  Medicare Attestation I have personally reviewed: The patient's medical and social history Their use of alcohol, tobacco or illicit drugs Their current  medications and supplements The patient's functional ability including ADLs,fall risks, home safety risks, cognitive, and hearing and visual impairment Diet and  physical activities Evidence for depression   In addition, I have reviewed and discussed with patient certain preventive protocols, quality metrics, and best practice recommendations. A written personalized care plan for preventive services as well as general preventive health recommendations were provided to patient via mail.     Ashok Pall, LPN  [id number]    Agree TMS

## 2019-09-09 NOTE — Progress Notes (Signed)
patient not in ncir

## 2019-09-10 ENCOUNTER — Telehealth: Payer: Self-pay | Admitting: Internal Medicine

## 2019-09-10 NOTE — Telephone Encounter (Signed)
I called and left pt a vm to call ofc to schedule Return for 4-6 months.

## 2019-09-26 ENCOUNTER — Ambulatory Visit: Payer: Medicare HMO | Admitting: Internal Medicine

## 2019-12-05 ENCOUNTER — Telehealth: Payer: Self-pay | Admitting: Internal Medicine

## 2019-12-05 NOTE — Telephone Encounter (Signed)
err

## 2019-12-05 NOTE — Telephone Encounter (Signed)
All medications coming due around May and June.   Vitamin D and Lancets are coming due now.  Okay to send?

## 2019-12-05 NOTE — Telephone Encounter (Signed)
Patient is requesting refill on her medications that need to be filled. Patient could not give me the names of medications.

## 2019-12-07 ENCOUNTER — Other Ambulatory Visit: Payer: Self-pay | Admitting: Internal Medicine

## 2019-12-07 DIAGNOSIS — I10 Essential (primary) hypertension: Secondary | ICD-10-CM

## 2019-12-07 DIAGNOSIS — E1122 Type 2 diabetes mellitus with diabetic chronic kidney disease: Secondary | ICD-10-CM

## 2019-12-07 DIAGNOSIS — E785 Hyperlipidemia, unspecified: Secondary | ICD-10-CM

## 2019-12-07 MED ORDER — TELMISARTAN 40 MG PO TABS: 40.0000 mg | ORAL_TABLET | Freq: Every day | ORAL | 3 refills | Status: DC

## 2019-12-07 MED ORDER — ASPIRIN EC 81 MG PO TBEC: 81.0000 mg | DELAYED_RELEASE_TABLET | Freq: Every day | ORAL | 3 refills | Status: DC

## 2019-12-07 MED ORDER — ATORVASTATIN CALCIUM 40 MG PO TABS: 40.0000 mg | ORAL_TABLET | Freq: Every day | ORAL | 3 refills | Status: DC

## 2019-12-07 MED ORDER — AMLODIPINE BESYLATE 5 MG PO TABS: 5.0000 mg | ORAL_TABLET | Freq: Every day | ORAL | 3 refills | Status: DC

## 2019-12-07 MED ORDER — TRUE METRIX BLOOD GLUCOSE TEST VI STRP: ORAL_STRIP | 3 refills | Status: DC

## 2019-12-07 MED ORDER — GLIPIZIDE-METFORMIN HCL 5-500 MG PO TABS: ORAL_TABLET | ORAL | 3 refills | Status: DC

## 2019-12-07 MED ORDER — TRUEPLUS LANCETS 28G MISC: 3.0000 | Freq: Three times a day (TID) | 11 refills | Status: DC

## 2019-12-07 NOTE — Telephone Encounter (Signed)
Refilled all meds vitamin D3 can take in a multivitamin otc I.e centrum or nature made  Also with vitamin D3 2000 IU daily otc  Stop 1x per week D3 high dose  Inform pt   TMS

## 2019-12-09 NOTE — Telephone Encounter (Signed)
Left message to return call 

## 2019-12-09 NOTE — Telephone Encounter (Signed)
Informed patient of medication changes. Patient verbalized understanding and med list updated.

## 2020-03-11 ENCOUNTER — Ambulatory Visit: Payer: Medicare HMO | Admitting: Internal Medicine

## 2020-03-31 ENCOUNTER — Ambulatory Visit: Payer: Medicare HMO | Admitting: Internal Medicine

## 2020-04-01 ENCOUNTER — Other Ambulatory Visit: Payer: Self-pay

## 2020-04-01 ENCOUNTER — Ambulatory Visit (INDEPENDENT_AMBULATORY_CARE_PROVIDER_SITE_OTHER): Payer: Medicare HMO

## 2020-04-01 ENCOUNTER — Encounter: Payer: Self-pay | Admitting: Internal Medicine

## 2020-04-01 ENCOUNTER — Ambulatory Visit (INDEPENDENT_AMBULATORY_CARE_PROVIDER_SITE_OTHER): Payer: Medicare HMO | Admitting: Internal Medicine

## 2020-04-01 VITALS — BP 106/66 | HR 66 | Temp 98.2°F | Ht 65.0 in | Wt 167.8 lb

## 2020-04-01 DIAGNOSIS — I700 Atherosclerosis of aorta: Secondary | ICD-10-CM | POA: Insufficient documentation

## 2020-04-01 DIAGNOSIS — R634 Abnormal weight loss: Secondary | ICD-10-CM | POA: Diagnosis not present

## 2020-04-01 DIAGNOSIS — I10 Essential (primary) hypertension: Secondary | ICD-10-CM | POA: Diagnosis not present

## 2020-04-01 DIAGNOSIS — E1159 Type 2 diabetes mellitus with other circulatory complications: Secondary | ICD-10-CM

## 2020-04-01 DIAGNOSIS — I152 Hypertension secondary to endocrine disorders: Secondary | ICD-10-CM

## 2020-04-01 DIAGNOSIS — J90 Pleural effusion, not elsewhere classified: Secondary | ICD-10-CM | POA: Diagnosis not present

## 2020-04-01 LAB — CBC WITH DIFFERENTIAL/PLATELET
Basophils Absolute: 0 10*3/uL (ref 0.0–0.1)
Basophils Relative: 0.2 % (ref 0.0–3.0)
Eosinophils Absolute: 0.1 10*3/uL (ref 0.0–0.7)
Eosinophils Relative: 1.1 % (ref 0.0–5.0)
HCT: 35.3 % — ABNORMAL LOW (ref 36.0–46.0)
Hemoglobin: 11.6 g/dL — ABNORMAL LOW (ref 12.0–15.0)
Lymphocytes Relative: 20.4 % (ref 12.0–46.0)
Lymphs Abs: 1.5 10*3/uL (ref 0.7–4.0)
MCHC: 32.8 g/dL (ref 30.0–36.0)
MCV: 90.1 fl (ref 78.0–100.0)
Monocytes Absolute: 0.5 10*3/uL (ref 0.1–1.0)
Monocytes Relative: 6.2 % (ref 3.0–12.0)
Neutro Abs: 5.4 10*3/uL (ref 1.4–7.7)
Neutrophils Relative %: 72.1 % (ref 43.0–77.0)
Platelets: 256 10*3/uL (ref 150.0–400.0)
RBC: 3.92 Mil/uL (ref 3.87–5.11)
RDW: 13.9 % (ref 11.5–15.5)
WBC: 7.5 10*3/uL (ref 4.0–10.5)

## 2020-04-01 LAB — LIPID PANEL
Cholesterol: 141 mg/dL (ref 0–200)
HDL: 62.6 mg/dL (ref >39.00)
LDL Cholesterol: 65 mg/dL (ref 0–99)
NonHDL: 78.25
Total CHOL/HDL Ratio: 2
Triglycerides: 64 mg/dL (ref 0.0–149.0)
VLDL: 12.8 mg/dL (ref 0.0–40.0)

## 2020-04-01 LAB — COMPREHENSIVE METABOLIC PANEL
ALT: 9 U/L (ref 0–35)
AST: 17 U/L (ref 0–37)
Albumin: 4.3 g/dL (ref 3.5–5.2)
Alkaline Phosphatase: 73 U/L (ref 39–117)
BUN: 15 mg/dL (ref 6–23)
CO2: 30 mEq/L (ref 19–32)
Calcium: 9.7 mg/dL (ref 8.4–10.5)
Chloride: 104 mEq/L (ref 96–112)
Creatinine, Ser: 1.16 mg/dL (ref 0.40–1.20)
GFR: 55.72 mL/min — ABNORMAL LOW (ref >60.00)
Glucose, Bld: 94 mg/dL (ref 70–99)
Potassium: 3.5 mEq/L (ref 3.5–5.1)
Sodium: 140 mEq/L (ref 135–145)
Total Bilirubin: 0.5 mg/dL (ref 0.2–1.2)
Total Protein: 7.6 g/dL (ref 6.0–8.3)

## 2020-04-01 LAB — MICROALBUMIN / CREATININE URINE RATIO
Creatinine,U: 377.1 mg/dL
Microalb Creat Ratio: 2 mg/g (ref 0.0–30.0)
Microalb, Ur: 7.7 mg/dL — ABNORMAL HIGH (ref 0.0–1.9)

## 2020-04-01 LAB — HEMOGLOBIN A1C: Hgb A1c MFr Bld: 6.3 % (ref 4.6–6.5)

## 2020-04-01 NOTE — Patient Instructions (Signed)
Voltaren gel 4x per day   Heat  Tylenol for knee pain  Arthritis Arthritis is a term that is commonly used to refer to joint pain or joint disease. There are more than 100 types of arthritis. What are the causes? The most common cause of this condition is wear and tear of a joint. Other causes include:  Gout.  Inflammation of a joint.  An infection of a joint.  Sprains and other injuries near the joint.  A reaction to medicines or drugs, or an allergic reaction. In some cases, the cause may not be known. What are the signs or symptoms? The main symptom of this condition is pain in the joint during movement. Other symptoms include:  Redness, swelling, or stiffness at a joint.  Warmth coming from the joint.  Fever.  Overall feeling of illness. How is this diagnosed? This condition may be diagnosed with a physical exam and tests, including:  Blood tests.  Urine tests.  Imaging tests, such as X-rays, an MRI, or a CT scan. Sometimes, fluid is removed from a joint for testing. How is this treated? This condition may be treated with:  Treatment of the cause, if it is known.  Rest.  Raising (elevating) the joint.  Applying cold or hot packs to the joint.  Medicines to improve symptoms and reduce inflammation.  Injections of a steroid such as cortisone into the joint to help reduce pain and inflammation. Depending on the cause of your arthritis, you may need to make lifestyle changes to reduce stress on your joint. Changes may include:  Exercising more.  Losing weight. Follow these instructions at home: Medicines  Take over-the-counter and prescription medicines only as told by your health care provider.  Do not take aspirin to relieve pain if your health care provider thinks that gout may be causing your pain. Activity  Rest your joint if told by your health care provider. Rest is important when your disease is active and your joint feels painful, swollen, or  stiff.  Avoid activities that make the pain worse. It is important to balance activity with rest.  Exercise your joint regularly with range-of-motion exercises as told by your health care provider. Try doing low-impact exercise, such as: ? Swimming. ? Water aerobics. ? Biking. ? Walking. Managing pain, stiffness, and swelling      If directed, put ice on the joint. ? Put ice in a plastic bag. ? Place a towel between your skin and the bag. ? Leave the ice on for 20 minutes, 2-3 times per day.  If your joint is swollen, raise (elevate) it above the level of your heart if directed by your health care provider.  If your joint feels stiff in the morning, try taking a warm shower.  If directed, apply heat to the affected area as often as told by your health care provider. Use the heat source that your health care provider recommends, such as a moist heat pack or a heating pad. If you have diabetes, do not apply heat without permission from your health care provider. To apply heat: ? Place a towel between your skin and the heat source. ? Leave the heat on for 20-30 minutes. ? Remove the heat if your skin turns bright red. This is especially important if you are unable to feel pain, heat, or cold. You may have a greater risk of getting burned. General instructions  Do not use any products that contain nicotine or tobacco, such as cigarettes, e-cigarettes, and  chewing tobacco. If you need help quitting, ask your health care provider.  Keep all follow-up visits as told by your health care provider. This is important. Contact a health care provider if:  The pain gets worse.  You have a fever. Get help right away if:  You develop severe joint pain, swelling, or redness.  Many joints become painful and swollen.  You develop severe back pain.  You develop severe weakness in your leg.  You cannot control your bladder or bowels. Summary  Arthritis is a term that is commonly used to  refer to joint pain or joint disease. There are more than 100 types of arthritis.  The most common cause of this condition is wear and tear of a joint. Other causes include gout, inflammation or infection of the joint, sprains, or allergies.  Symptoms of this condition include redness, swelling, or stiffness of the joint. Other symptoms include warmth, fever, or feeling ill.  This condition is treated with rest, elevation, medicines, and applying cold or hot packs.  Follow your health care provider's instructions about medicines, activity, exercises, and other home care treatments. This information is not intended to replace advice given to you by your health care provider. Make sure you discuss any questions you have with your health care provider. Document Revised: 09/09/2018 Document Reviewed: 09/09/2018 Elsevier Patient Education  2020 [employer].

## 2020-04-01 NOTE — Progress Notes (Signed)
Chief Complaint  Patient presents with  . Follow-up   F/u  1. HTN BP low normal on norvasc 5 mg qd micardis 40 mg qd  2. Continued weight loss declines w/u mammogram, colonoscopy agreeable to CXR not to US abdomen  She reports working out walking 1-3 x per day and reduced food/appetite wt from 180 to 167  3. DM 2 cbg 110 this am on metaglip 5-500 2 pills in am and 1 pill qhs A1C 07/25/19 6.4   Review of Systems  Constitutional: Positive for weight loss.  HENT: Negative for hearing loss.   Eyes: Negative for blurred vision.  Respiratory: Negative for shortness of breath.   Cardiovascular: Negative for chest pain.  Musculoskeletal: Negative for falls.  Skin: Negative for rash.  Psychiatric/Behavioral: Negative for depression and memory loss.   Past Medical History:  Diagnosis Date  . Arthritis    knees  . Chronic kidney disease   . Diabetes mellitus without complication (HCC)   . Hyperlipidemia   . Hypertension   . Obesity    Past Surgical History:  Procedure Laterality Date  . EYE SURGERY     b/l cataract 2004    Family History  Problem Relation Age of Onset  . Arthritis Sister   . Hypertension Sister   . Hyperlipidemia Sister   . Diabetes Son   . Mental illness Son        bipolar/cognitive deficit   . Hyperlipidemia Son   . Hypertension Son   . Arthritis Sister   . Hypertension Sister   . Hyperlipidemia Sister   . Arthritis Sister   . Hypertension Sister   . Hyperlipidemia Sister   . Arthritis Sister   . Hyperlipidemia Sister   . Hypertension Sister   . Cancer Mother        colon cancer   . Arthritis Mother   . Cancer Father        lung, smoker   Social History   Socioeconomic History  . Marital status: Unknown    Spouse name: Not on file  . Number of children: Not on file  . Years of education: Not on file  . Highest education level: Not on file  Occupational History  . Not on file  Tobacco Use  . Smoking status: Never Smoker  . Smokeless  tobacco: Never Used  Substance and Sexual Activity  . Alcohol use: No  . Drug use: No  . Sexual activity: Never    Partners: Male  Other Topics Concern  . Not on file  Social History Narrative   No etoh, drugs, never smoker    Walks her dog    No guns, wears seatbelt    Lives with son age 69 as of 08/2019 had her age 63   Used to be Cytogeneticist    Social Determinants of Corporate investment banker Strain:   . Difficulty of Paying Living Expenses:   Food Insecurity:   . Worried About Programme researcher, broadcasting/film/video in the Last Year:   . Barista in the Last Year:   Transportation Needs:   . Freight forwarder (Medical):   Marland Kitchen Lack of Transportation (Non-Medical):   Physical Activity:   . Days of Exercise per Week:   . Minutes of Exercise per Session:   Stress:   . Feeling of Stress :   Social Connections:   . Frequency of Communication with Friends and Family:   . Frequency of Social Gatherings with  Friends and Family:   . Attends Religious Services:   . Active Member of Clubs or Organizations:   . Attends Banker Meetings:   Marland Kitchen Marital Status:   Intimate Partner Violence:   . Fear of Current or Ex-Partner:   . Emotionally Abused:   Marland Kitchen Physically Abused:   . Sexually Abused:    Current Meds  Medication Sig  . amLODipine (NORVASC) 5 MG tablet Take 1 tablet (5 mg total) by mouth daily. Do not cut in half  . aspirin EC 81 MG tablet Take 1 tablet (81 mg total) by mouth daily.  Marland Kitchen atorvastatin (LIPITOR) 40 MG tablet Take 1 tablet (40 mg total) by mouth daily at 6 PM.  . Cholecalciferol (VITAMIN D3 PO) Take 2,000 Units by mouth daily.  Marland Kitchen glipiZIDE-metformin (METAGLIP) 5-500 MG tablet 2 pills in am with food and 1 pill at night  . Multiple Vitamin (MULTIVITAMIN ADULT PO) Take by mouth.  . telmisartan (MICARDIS) 40 MG tablet Take 1 tablet (40 mg total) by mouth daily.  . TRUE METRIX BLOOD GLUCOSE TEST test strip Bid E11.9  . TRUEplus Lancets 28G MISC 3 Devices by  Other route 3 (three) times daily.   No Known Allergies No results found for this or any previous visit (from the past 2160 hour(s)). Objective  Body mass index is 27.92 kg/m. Wt Readings from Last 3 Encounters:  04/01/20 167 lb 12.8 oz (76.1 kg)  09/09/19 180 lb (81.6 kg)  09/05/18 170 lb 9.6 oz (77.4 kg)   Temp Readings from Last 3 Encounters:  04/01/20 98.2 F (36.8 C) (Oral)  09/05/18 98.3 F (36.8 C) (Oral)  09/05/18 98.3 F (36.8 C) (Oral)   BP Readings from Last 3 Encounters:  04/01/20 106/66  09/05/18 122/64  09/05/18 122/64   Pulse Readings from Last 3 Encounters:  04/01/20 66  09/05/18 83  09/05/18 83    Physical Exam Vitals and nursing note reviewed.  Constitutional:      Appearance: Normal appearance. She is well-developed and well-groomed.  HENT:     Head: Normocephalic and atraumatic.  Eyes:     Conjunctiva/sclera: Conjunctivae normal.     Pupils: Pupils are equal, round, and reactive to light.  Cardiovascular:     Rate and Rhythm: Normal rate and regular rhythm.     Heart sounds: Normal heart sounds. No murmur heard.   Pulmonary:     Effort: Pulmonary effort is normal.     Breath sounds: Normal breath sounds.  Skin:    General: Skin is warm and dry.  Neurological:     General: No focal deficit present.     Mental Status: She is alert and oriented to person, place, and time. Mental status is at baseline.     Gait: Gait normal.  Psychiatric:        Attention and Perception: Attention and perception normal.        Mood and Affect: Mood and affect normal.        Speech: Speech normal.        Behavior: Behavior normal. Behavior is cooperative.        Thought Content: Thought content normal.        Cognition and Memory: Cognition and memory normal.        Judgment: Judgment normal.     Assessment  Plan  Hypertension associated with diabetes (HCC) - Plan: Comprehensive metabolic panel, Lipid panel, CBC with Differential/Platelet, Hemoglobin  A1c, Microalbumin / creatinine urine ratio Cont meds  Essential hypertension - Plan: Comprehensive metabolic panel, Lipid panel Cont meds  Weight loss - Plan: DG Chest 2 View  Disc [place] ab pt declines and declined other HM   HM Flu shot utd  prevnarhad, pna 23 due 3/2020pt declines 09/09/19  Tdap disc'edwill give Rx at f/[facility]ines for now as of 09/09/19  covid shots 2/2 had dates ? Declines to be tested hep B/C in past but consider check with next labs hep C rec D3 2000 IU qd   Declines mammo, pap, colonoscopy, cologaurd(mother died of colon cancer not candidate)consider fobt stool tests in future(pt to have through insurance mail order and will fax me result of fobt) disc DEXA pt declines prev   Declines all health maintenance as of 09/09/19 and 04/01/20 and prior to this as well   -have discussed with patient multiple times   Provider: Dr. Olivia Mackie McLean-Scocuzza-[facility]

## 2020-04-02 ENCOUNTER — Telehealth: Payer: Self-pay | Admitting: Internal Medicine

## 2020-04-02 NOTE — Telephone Encounter (Signed)
-----   Message from Bevelyn Buckles, MD sent at 04/01/2020  5:16 PM EDT ----- Protein in urine will monitor  A1c 6.3=diabetes controlled  -is she having lows < 70 at home if so rec reduce diabetic med 2 pills in am and 1 pill at night to 1 pill 2x per day with food    Liver kidneys stable/normal Cholesterol normal Slightly anemic

## 2020-04-02 NOTE — Telephone Encounter (Signed)
Patient informed and verbalized understanding.  She has not been having any low sugars but will call to inform us if this happens.

## 2020-04-02 NOTE — Telephone Encounter (Signed)
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

## 2020-04-02 NOTE — Telephone Encounter (Signed)
Pt called to check on results.

## 2020-05-25 ENCOUNTER — Telehealth: Payer: Self-pay | Admitting: Internal Medicine

## 2020-05-25 DIAGNOSIS — E1122 Type 2 diabetes mellitus with diabetic chronic kidney disease: Secondary | ICD-10-CM

## 2020-05-25 MED ORDER — TRUEPLUS LANCETS 28G MISC: 3.0000 | Freq: Three times a day (TID) | 11 refills | Status: DC

## 2020-05-25 MED ORDER — TRUE METRIX BLOOD GLUCOSE TEST VI STRP: ORAL_STRIP | 3 refills | Status: DC

## 2020-05-25 NOTE — Telephone Encounter (Signed)
Pt called in need refill on TRUE METRIX BLOOD GLUCOSE TEST test strip

## 2020-05-26 ENCOUNTER — Other Ambulatory Visit: Payer: Self-pay

## 2020-05-26 DIAGNOSIS — E1122 Type 2 diabetes mellitus with diabetic chronic kidney disease: Secondary | ICD-10-CM

## 2020-05-26 MED ORDER — TRUE METRIX BLOOD GLUCOSE TEST VI STRP: ORAL_STRIP | 3 refills | Status: DC

## 2020-08-05 ENCOUNTER — Ambulatory Visit: Payer: Medicare HMO | Admitting: Internal Medicine

## 2020-09-03 ENCOUNTER — Ambulatory Visit: Payer: Medicare HMO | Admitting: Internal Medicine

## 2020-09-13 ENCOUNTER — Ambulatory Visit: Payer: Medicare HMO

## 2020-09-13 ENCOUNTER — Telehealth: Payer: Self-pay

## 2020-09-13 NOTE — Telephone Encounter (Signed)
Unsuccessful attempt to reach patient for scheduled AWV on preferred number. No answer. Left message to reschedule.

## 2020-09-14 ENCOUNTER — Ambulatory Visit (INDEPENDENT_AMBULATORY_CARE_PROVIDER_SITE_OTHER): Payer: Medicare HMO

## 2020-09-14 ENCOUNTER — Other Ambulatory Visit: Payer: Self-pay

## 2020-09-14 VITALS — Ht 64.0 in | Wt 167.0 lb

## 2020-09-14 DIAGNOSIS — Z1159 Encounter for screening for other viral diseases: Secondary | ICD-10-CM | POA: Diagnosis not present

## 2020-09-14 DIAGNOSIS — E1122 Type 2 diabetes mellitus with diabetic chronic kidney disease: Secondary | ICD-10-CM

## 2020-09-14 DIAGNOSIS — Z0000 Encounter for general adult medical examination without abnormal findings: Secondary | ICD-10-CM | POA: Diagnosis not present

## 2020-09-14 DIAGNOSIS — I10 Essential (primary) hypertension: Secondary | ICD-10-CM

## 2020-09-14 DIAGNOSIS — E785 Hyperlipidemia, unspecified: Secondary | ICD-10-CM

## 2020-09-14 MED ORDER — ATORVASTATIN CALCIUM 40 MG PO TABS: 40.0000 mg | ORAL_TABLET | Freq: Every day | ORAL | 3 refills | Status: DC

## 2020-09-14 MED ORDER — ASPIRIN EC 81 MG PO TBEC: 81.0000 mg | DELAYED_RELEASE_TABLET | Freq: Every day | ORAL | 3 refills | Status: DC

## 2020-09-14 MED ORDER — TELMISARTAN 40 MG PO TABS: 40.0000 mg | ORAL_TABLET | Freq: Every day | ORAL | 3 refills | Status: DC

## 2020-09-14 MED ORDER — AMLODIPINE BESYLATE 5 MG PO TABS: 5.0000 mg | ORAL_TABLET | Freq: Every day | ORAL | 3 refills | Status: DC

## 2020-09-14 NOTE — Patient Instructions (Addendum)
Ms. Teschner , Thank you for taking time to come for your Medicare Wellness Visit. I appreciate your ongoing commitment to your health goals. Please review the following plan we discussed and let me know if I can assist you in the future.   These are the goals we discussed: Goals      Patient Stated   .  Healthy Lifestyle (pt-stated)      Stay active Compliant with medications Drink plenty of fluids Eat healthy       This is a list of the screening recommended for you and due dates:  Health Maintenance  Topic Date Due  .  Hepatitis C: One time screening is recommended by Center for Disease Control  (CDC) for  adults born from [birth year] through 1967.   Never done  . Eye exam for diabetics  Never done  . COVID-19 Vaccine (1) Never done  . Pneumonia vaccines (2 of 2 - PPSV23) 10/12/2020*  . Mammogram  09/14/2021*  . DEXA scan (bone density measurement)  09/14/2021*  . Colon Cancer Screening  09/14/2021*  . Tetanus Vaccine  09/14/2021*  . Hemoglobin A1C  10/01/2020  . Complete foot exam   04/01/2021  . Flu Shot  Completed  *Topic was postponed. The date shown is not the original due date.   Keep all routine maintenance appointments.   Follow up 10/12/20 @ 11:30  Advanced directives: not yet completed  Conditions/risks identified: none new  Follow up in one year for your annual wellness visit    Preventive Care 65 Years and Older, Female Preventive care refers to lifestyle choices and visits with your health care provider that can promote health and wellness. What does preventive care include?  A yearly physical exam. This is also called an annual well check.  Dental exams once or twice a year.  Routine eye exams. Ask your health care provider how often you should have your eyes checked.  Personal lifestyle choices, including:  Daily care of your teeth and gums.  Regular physical activity.  Eating a healthy diet.  Avoiding tobacco and drug use.  Limiting alcohol  use.  Practicing safe sex.  Taking low-dose aspirin every day.  Taking vitamin and mineral supplements as recommended by your health care provider. What happens during an annual well check? The services and screenings done by your health care provider during your annual well check will depend on your age, overall health, lifestyle risk factors, and family history of disease. Counseling  Your health care provider may ask you questions about your:  Alcohol use.  Tobacco use.  Drug use.  Emotional well-being.  Home and relationship well-being.  Sexual activity.  Eating habits.  History of falls.  Memory and ability to understand (cognition).  Work and work Astronomer.  Reproductive health. Screening  You may have the following tests or measurements:  Height, weight, and BMI.  Blood pressure.  Lipid and cholesterol levels. These may be checked every 5 years, or more frequently if you are over 43 years old.  Skin check.  Lung cancer screening. You may have this screening every year starting at age 25 if you have a 30-pack-year history of smoking and currently smoke or have quit within the past 15 years.  Fecal occult blood test (FOBT) of the stool. You may have this test every year starting at age 60.  Flexible sigmoidoscopy or colonoscopy. You may have a sigmoidoscopy every 5 years or a colonoscopy every 10 years starting at age 56.  Hepatitis  C blood test.  Hepatitis B blood test.  Sexually transmitted disease (STD) testing.  Diabetes screening. This is done by checking your blood sugar (glucose) after you have not eaten for a while (fasting). You may have this done every 1-3 years.  Bone density scan. This is done to screen for osteoporosis. You may have this done starting at age 64.  Mammogram. This may be done every 1-2 years. Talk to your health care provider about how often you should have regular mammograms. Talk with your health care provider about  your test results, treatment options, and if necessary, the need for more tests. Vaccines  Your health care provider may recommend certain vaccines, such as:  Influenza vaccine. This is recommended every year.  Tetanus, diphtheria, and acellular pertussis (Tdap, Td) vaccine. You may need a Td booster every 10 years.  Zoster vaccine. You may need this after age 33.  Pneumococcal 13-valent conjugate (PCV13) vaccine. One dose is recommended after age 58.  Pneumococcal polysaccharide (PPSV23) vaccine. One dose is recommended after age 27. Talk to your health care provider about which screenings and vaccines you need and how often you need them. This information is not intended to replace advice given to you by your health care provider. Make sure you discuss any questions you have with your health care provider. Document Released: 10/29/2015 Document Revised: 06/21/2016 Document Reviewed: 08/03/2015 Elsevier Interactive Patient Education  2017 [employer].  Fall Prevention in the Home Falls can cause injuries. They can happen to people of all ages. There are many things you can do to make your home safe and to help prevent falls. What can I do on the outside of my home?  Regularly fix the edges of walkways and driveways and fix any cracks.  Remove anything that might make you trip as you walk through a door, such as a raised step or threshold.  Trim any bushes or trees on the path to your home.  Use bright outdoor lighting.  Clear any walking paths of anything that might make someone trip, such as rocks or tools.  Regularly check to see if handrails are loose or broken. Make sure that both sides of any steps have handrails.  Any raised decks and porches should have guardrails on the edges.  Have any leaves, snow, or ice cleared regularly.  Use sand or salt on walking paths during winter.  Clean up any spills in your garage right away. This includes oil or grease spills. What  can I do in the bathroom?  Use night lights.  Install grab bars by the toilet and in the tub and shower. Do not use towel bars as grab bars.  Use non-skid mats or decals in the tub or shower.  If you need to sit down in the shower, use a plastic, non-slip stool.  Keep the floor dry. Clean up any water that spills on the floor as soon as it happens.  Remove soap buildup in the tub or shower regularly.  Attach bath mats securely with double-sided non-slip rug tape.  Do not have throw rugs and other things on the floor that can make you trip. What can I do in the bedroom?  Use night lights.  Make sure that you have a light by your bed that is easy to reach.  Do not use any sheets or blankets that are too big for your bed. They should not hang down onto the floor.  Have a firm chair that has side arms.  You can use this for support while you get dressed.  Do not have throw rugs and other things on the floor that can make you trip. What can I do in the kitchen?  Clean up any spills right away.  Avoid walking on wet floors.  Keep items that you use a lot in easy-to-reach places.  If you need to reach something above you, use a strong step stool that has a grab bar.  Keep electrical cords out of the way.  Do not use floor polish or wax that makes floors slippery. If you must use wax, use non-skid floor wax.  Do not have throw rugs and other things on the floor that can make you trip. What can I do with my stairs?  Do not leave any items on the stairs.  Make sure that there are handrails on both sides of the stairs and use them. Fix handrails that are broken or loose. Make sure that handrails are as long as the stairways.  Check any carpeting to make sure that it is firmly attached to the stairs. Fix any carpet that is loose or worn.  Avoid having throw rugs at the top or bottom of the stairs. If you do have throw rugs, attach them to the floor with carpet tape.  Make sure  that you have a light switch at the top of the stairs and the bottom of the stairs. If you do not have them, ask someone to add them for you. What else can I do to help prevent falls?  Wear shoes that:  Do not have high heels.  Have rubber bottoms.  Are comfortable and fit you well.  Are closed at the toe. Do not wear sandals.  If you use a stepladder:  Make sure that it is fully opened. Do not climb a closed stepladder.  Make sure that both sides of the stepladder are locked into place.  Ask someone to hold it for you, if possible.  Clearly mark and make sure that you can see:  Any grab bars or handrails.  First and last steps.  Where the edge of each step is.  Use tools that help you move around (mobility aids) if they are needed. These include:  Canes.  Walkers.  Scooters.  Crutches.  Turn on the lights when you go into a dark area. Replace any light bulbs as soon as they burn out.  Set up your furniture so you have a clear path. Avoid moving your furniture around.  If any of your floors are uneven, fix them.  If there are any pets around you, be aware of where they are.  Review your medicines with your doctor. Some medicines can make you feel dizzy. This can increase your chance of falling. Ask your doctor what other things that you can do to help prevent falls. This information is not intended to replace advice given to you by your health care provider. Make sure you discuss any questions you have with your health care provider. Document Released: 07/29/2009 Document Revised: 03/09/2016 Document Reviewed: 11/06/2014 Elsevier Interactive Patient Education  2017 [employer].

## 2020-09-14 NOTE — Progress Notes (Signed)
Subjective:   Ebony Reese is a 69 y.o. female who presents for Medicare Annual (Subsequent) preventive examination.  Review of Systems    No ROS.  Medicare Wellness Virtual Visit.    Cardiac Risk Factors include: advanced age (>70men, >63 women);hypertension;diabetes mellitus     Objective:    Today's Vitals   09/14/20 0904  Weight: 167 lb (75.8 kg)  Height: 5\' 4"  (1.626 m)   Body mass index is 28.67 kg/m.  Advanced Directives 09/14/2020 09/09/2019 09/05/2018  Does Patient Have a Medical Advance Directive? No No No  Would patient like information on creating a medical advance directive? No - Patient declined No - Patient declined No - Patient declined    Current Medications (verified) Outpatient Encounter Medications as of 09/14/2020  Medication Sig  . Cholecalciferol (VITAMIN D3 PO) Take 2,000 Units by mouth daily.  09/16/2020 glipiZIDE-metformin (METAGLIP) 5-500 MG tablet 2 pills in am with food and 1 pill at night  . Multiple Vitamin (MULTIVITAMIN ADULT PO) Take by mouth.  . TRUE METRIX BLOOD GLUCOSE TEST test strip Bid E11.9  . TRUEplus Lancets 28G MISC 3 Devices by Other route 3 (three) times daily.  . [DISCONTINUED] amLODipine (NORVASC) 5 MG tablet Take 1 tablet (5 mg total) by mouth daily. Do not cut in half  . [DISCONTINUED] aspirin EC 81 MG tablet Take 1 tablet (81 mg total) by mouth daily.  . [DISCONTINUED] atorvastatin (LIPITOR) 40 MG tablet Take 1 tablet (40 mg total) by mouth daily at 6 PM.  . [DISCONTINUED] telmisartan (MICARDIS) 40 MG tablet Take 1 tablet (40 mg total) by mouth daily.   No facility-administered encounter medications on file as of 09/14/2020.    Allergies (verified) Patient has no known allergies.   History: Past Medical History:  Diagnosis Date  . Arthritis    knees  . Chronic kidney disease   . Diabetes mellitus without complication (HCC)   . Hyperlipidemia   . Hypertension   . Obesity    Past Surgical History:  Procedure  Laterality Date  . EYE SURGERY     b/l cataract 2004    Family History  Problem Relation Age of Onset  . Arthritis Sister   . Hypertension Sister   . Hyperlipidemia Sister   . Diabetes Son   . Mental illness Son        bipolar/cognitive deficit   . Hyperlipidemia Son   . Hypertension Son   . Arthritis Sister   . Hypertension Sister   . Hyperlipidemia Sister   . Arthritis Sister   . Hypertension Sister   . Hyperlipidemia Sister   . Arthritis Sister   . Hyperlipidemia Sister   . Hypertension Sister   . Cancer Mother        colon cancer   . Arthritis Mother   . Cancer Father        lung, smoker   Social History   Socioeconomic History  . Marital status: Unknown    Spouse name: Not on file  . Number of children: Not on file  . Years of education: Not on file  . Highest education level: Not on file  Occupational History  . Not on file  Tobacco Use  . Smoking status: Never Smoker  . Smokeless tobacco: Never Used  Substance and Sexual Activity  . Alcohol use: No  . Drug use: No  . Sexual activity: Never    Partners: Male  Other Topics Concern  . Not on file  Social History Narrative  No etoh, drugs, never smoker    Walks her dog    No guns, wears seatbelt    Lives with son age 76 as of 08/2019 had her age 69   Used to be Cytogeneticistmail worker    Social Determinants of Corporate investment [id number]   Financial Resource Strain: Low Risk   . Difficulty of Paying Living Expenses: Not hard at all  Food Insecurity: No Food Insecurity  . Worried About Programme researcher, broadcasting/film/videounning Out of Food in the Last Year: Never true  . Ran Out of Food in the Last Year: Never true  Transportation Needs: No Transportation Needs  . Lack of Transportation (Medical): No  . Lack of Transportation (Non-Medical): No  Physical Activity:   . Days of Exercise per Week: Not on file  . Minutes of Exercise per Session: Not on file  Stress: No Stress Concern Present  . Feeling of Stress : Not at all  Social Connections: Unknown  .  Frequency of Communication with Friends and Family: More than three times a week  . Frequency of Social Gatherings with Friends and Family: Not on file  . Attends Religious Services: Not on file  . Active Member of Clubs or Organizations: Not on file  . Attends BankerClub or Organization Meetings: Not on file  . Marital Status: Not on file    Tobacco Counseling Counseling given: Not Answered   Clinical Intake:  Pre-visit preparation completed: Yes        Diabetes: Yes (Followed by PCP)  How often do you need to have someone help you when you read instructions, pamphlets, or other written materials from your doctor or pharmacy?: 1 - Never Nutrition Risk Assessment: Has the patient had any N/V/D within the last 2 months?  No  Does the patient have any non-healing wounds?  No  Has the patient had any unintentional weight loss or weight gain?  No   Diabetes: Did the patient bring in their glucometer from home?  No  How often do you monitor your CBG's? Daily.   Financial Strains and Diabetes Management: Are you having any financial strains with the device, your supplies or your medication? No .  Does the patient want to be seen by Chronic Care Management for management of their diabetes?  No  Would the patient like to be referred to a Nutritionist or for Diabetic Management?  No   Diabetic Exams: Eye exam- plans to schedule.  Diabetic Foot Exam: Completed 03/22/20  Interpreter Needed?: No      Activities of Daily Living In your present state of health, do you have any difficulty performing the following activities: 09/14/2020  Hearing? N  Vision? N  Difficulty concentrating or making decisions? N  Walking or climbing stairs? N  Dressing or bathing? N  Doing errands, shopping? Y  Preparing Food and eating ? N  Using the Toilet? N  In the past six months, have you accidently leaked urine? N  Comment Managed with daily pad  Do you have problems with loss of bowel control? N    Managing your Medications? N  Managing your Finances? N  Housekeeping or managing your Housekeeping? N  Some recent data might be hidden    Patient Care Team: McLean-Scocuzza, Pasty Spillersracy N, MD as PCP - General (Internal Medicine)  Indicate any recent Medical Services you may have received from other than [facility] providers in the past year (date may be approximate).     Assessment:   This is a routine wellness examination [facility].  I connected with Dusty today by telephone and verified that I am speaking with the correct person using two identifiers. Location patient: home Location provider: work Persons participating in the virtual visit: patient, Engineer, civil (consulting).    I discussed the limitations, risks, security and privacy concerns of performing an evaluation and management service by telephone and the availability of in person appointments. The patient expressed understanding and verbally consented to this telephonic visit.    Interactive audio and video telecommunications were attempted between this provider and patient, however failed, due to patient having technical difficulties OR patient did not have access to video capability.  We continued and completed visit with audio only.  Some vital signs may be absent or patient reported.   Hearing/Vision screen  Hearing Screening   125Hz  250Hz  500Hz  1000Hz  2000Hz  3000Hz  4000Hz  6000Hz  8000Hz   Right ear:           Left ear:           Comments: Patient is able to hear conversational tones without difficulty.  No issues reported.  Vision Screening Comments: Wears reader glasses Cataract extraction, bilateral  Visual acuity not assessed, virtual visit. They have seen their ophthalmologist  Dietary issues and exercise activities discussed: Current Exercise Habits: Home exercise routine, Type of exercise: walking, Intensity: Mild  Goals      Patient Stated   .  Healthy Lifestyle (pt-stated)      Stay active Compliant with medications Drink  plenty of fluids Eat healthy      Depression Screen PHQ 2/9 Scores 09/14/2020 04/01/2020 04/11/2019 09/05/2018 04/02/2018 12/26/2017  PHQ - 2 Score 0 0 0 0 0 0    Fall Risk Fall Risk  09/14/2020 04/01/2020 04/11/2019 09/05/2018 04/02/2018  Falls in the past year? 0 0 0 0 No  Number falls in past yr: 0 0 0 - -  Injury with Fall? 0 0 0 - -  Follow up Falls evaluation completed Falls evaluation completed Falls evaluation completed - -   Handrails in use when climbing stairs? Yes Home free of loose throw rugs in walkways, pet beds, electrical cords, etc? Yes  Adequate lighting in your home to reduce risk of falls? Yes   ASSISTIVE DEVICES UTILIZED TO PREVENT FALLS: Use of a cane, walker or w/c? No   TIMED UP AND GO: Was the test performed? No . Virtual visit.   Cognitive Function: MMSE - Mini Mental State Exam 09/05/2018  Orientation to time 5  Orientation to Place 5  Registration 3  Attention/ Calculation 2  Attention/Calculation-comments Difficulty with calculation  Recall 0  Recall-comments 0/3 recalled  Language- name 2 objects 2  Language- repeat 1  Language- follow 3 step command 3  Language- read & follow direction 1  Write a sentence 1  Copy design 1  Total score 24     6CIT Screen 09/14/2020 09/09/2019 09/05/2018  What Year? 0 points 0 points 0 points  What month? 0 points 0 points 0 points  What time? - 0 points 0 points  Count back from 20 - 0 points 0 points  Months in reverse 0 points 2 points 4 points  Repeat phrase - 2 points -  Total Score - 4 -    Immunizations Immunization History  Administered Date(s) Administered  . Fluad Quad(high Dose 65+) 07/12/2019  . Influenza, High Dose Seasonal PF 09/05/2018, 08/12/2020  . Pneumococcal Conjugate-13 12/26/2017    TDAP status: Due, Education has been provided regarding the importance of this vaccine. Advised may receive  this vaccine at local pharmacy or Health Dept. Aware to provide a copy of the vaccination  record if obtained from local pharmacy or Health Dept. Verbalized acceptance and understanding. Deferred.  Pneumococcal vaccine status: Declined,  Education has been provided regarding the importance of this vaccine but patient still declined. Advised may receive this vaccine at local pharmacy or Health Dept. Aware to provide a copy of the vaccination record if obtained from local pharmacy or Health Dept. Verbalized acceptance and understanding.  Deferred.   Covid vaccine- complete. agrees to update immunization record.   Health Maintenance Health Maintenance  Topic Date Due  . Hepatitis C Screening  Never done  . OPHTHALMOLOGY EXAM  Never done  . COVID-19 Vaccine (1) Never done  . PNA vac Low Risk Adult (2 of 2 - PPSV23) 10/12/2020 (Originally 12/27/2018)  . MAMMOGRAM  09/14/2021 (Originally 03/27/1999)  . DEXA SCAN  09/14/2021 (Originally 03/26/2014)  . COLONOSCOPY  09/14/2021 (Originally 03/27/1999)  . TETANUS/TDAP  09/14/2021 (Originally 03/26/1968)  . HEMOGLOBIN A1C  10/01/2020  . FOOT EXAM  04/01/2021  . INFLUENZA VACCINE  Completed   Colonoscopy- declined  Mammogram- declined  Bone Density- declined  Lung Cancer Screening: (Low Dose CT Chest recommended if Age 4-80 years, 30 pack-year currently smoking OR have quit w/in 15years.) does not qualify.   Hepatitis C Screening: Consent given; future order placed.    Dental Screening: Recommended annual dental exams for proper oral hygiene.  Community Resource Referral / Chronic Care Management: CRR required this visit?  No   CCM required this visit?  No      Plan:   Keep all routine maintenance appointments.   Follow up 10/12/20 @ 11:30  I have personally reviewed and noted the following in the patient's chart:   . Medical and social history . Use of alcohol, tobacco or illicit drugs  . Current medications and supplements . Functional ability and status . Nutritional status . Physical activity . Advanced  directives . List of other physicians . Hospitalizations, surgeries, and ER visits in previous 12 months . Vitals . Screenings to include cognitive, depression, and falls . Referrals and appointments  In addition, I have reviewed and discussed with patient certain preventive protocols, quality metrics, and best practice recommendations. A written personalized care plan for preventive services as well as general preventive health recommendations were provided to patient via mail.     Ashok Pall, LPN   [id number]

## 2020-09-23 IMAGING — DX DG CHEST 2V
2 series · 2 of 2 positions shown · non-contrast
Comparison: No priors.

CLINICAL DATA: 69-year-old female with history of weight loss.
Evaluate for potential lung mass.

EXAM:
CHEST - 2 VIEW

[chest pa]
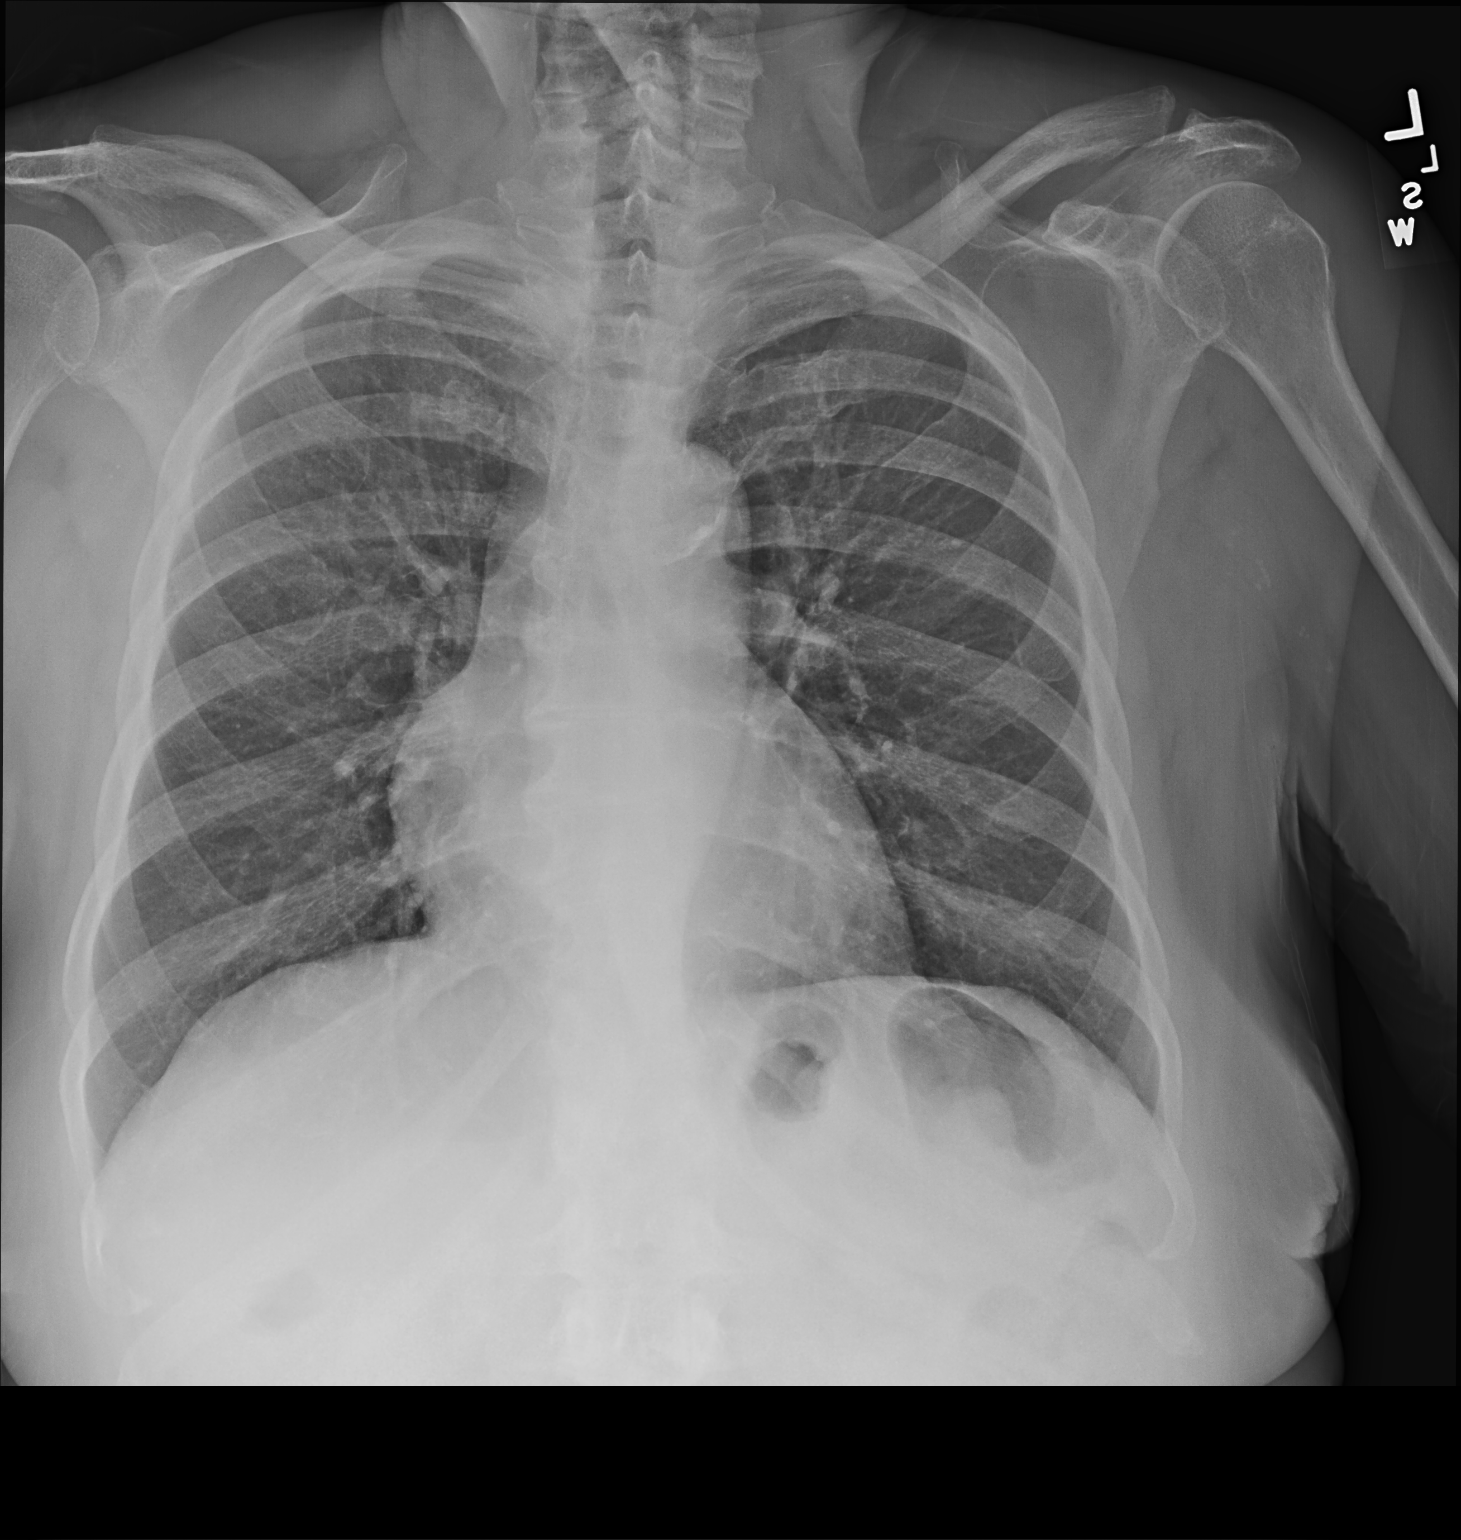

[chest lat]
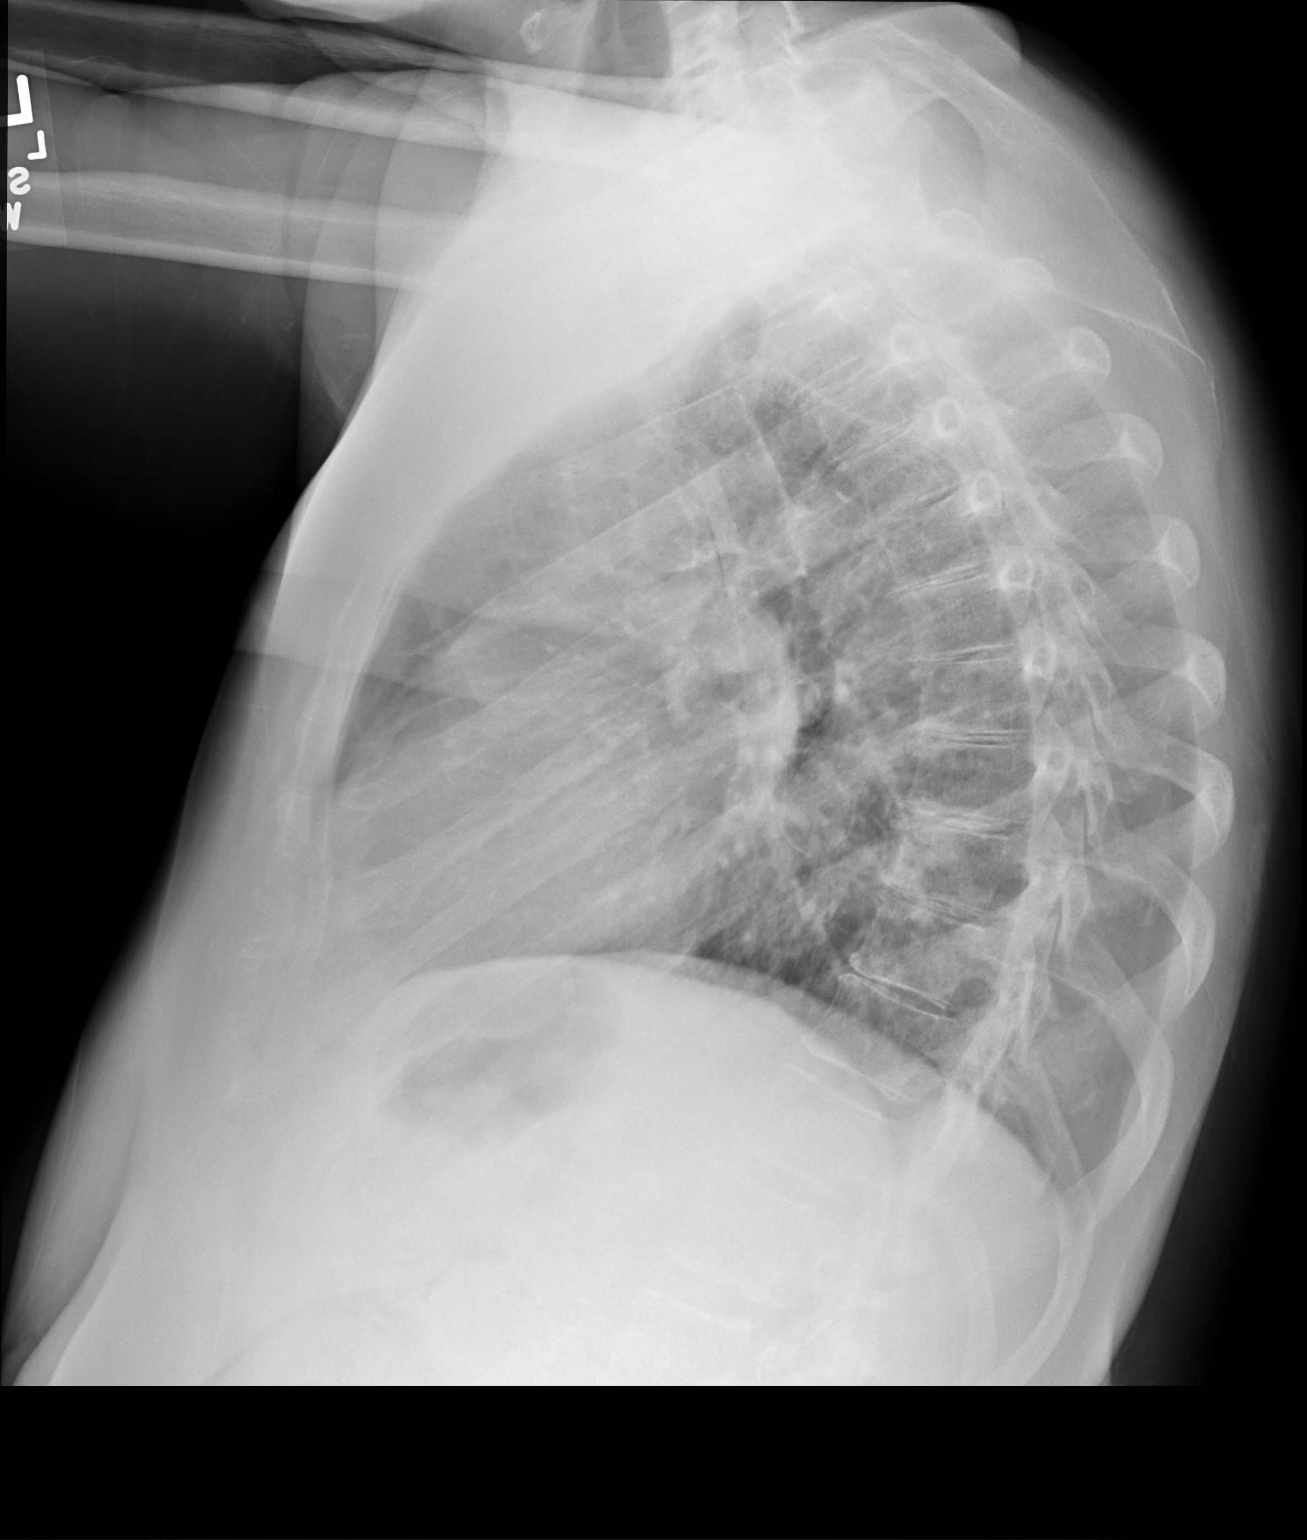

[2 of 2 positions shown; findings below may reference images not displayed]

FINDINGS: Lung volumes are normal. No consolidative airspace disease. No
pleural effusions. No pneumothorax. No pulmonary nodule or mass
noted. Pulmonary vasculature and the cardiomediastinal silhouette
are within normal limits. Atherosclerosis in the thoracic aorta.
IMPRESSION: 1.  No radiographic evidence of acute cardiopulmonary disease.
2. Aortic atherosclerosis.

## 2020-10-12 ENCOUNTER — Ambulatory Visit: Payer: Medicare HMO | Admitting: Internal Medicine

## 2020-11-02 ENCOUNTER — Ambulatory Visit: Payer: Medicare HMO | Admitting: Internal Medicine

## 2020-12-09 ENCOUNTER — Ambulatory Visit: Payer: Medicare HMO | Admitting: Internal Medicine

## 2020-12-21 ENCOUNTER — Ambulatory Visit: Payer: Medicare HMO | Admitting: Internal Medicine

## 2020-12-23 ENCOUNTER — Other Ambulatory Visit: Payer: Self-pay | Admitting: Internal Medicine

## 2020-12-23 DIAGNOSIS — E1122 Type 2 diabetes mellitus with diabetic chronic kidney disease: Secondary | ICD-10-CM

## 2020-12-30 ENCOUNTER — Other Ambulatory Visit: Payer: Self-pay | Admitting: Internal Medicine

## 2020-12-30 DIAGNOSIS — E1122 Type 2 diabetes mellitus with diabetic chronic kidney disease: Secondary | ICD-10-CM

## 2021-01-31 ENCOUNTER — Other Ambulatory Visit: Payer: Self-pay | Admitting: Internal Medicine

## 2021-01-31 DIAGNOSIS — E1122 Type 2 diabetes mellitus with diabetic chronic kidney disease: Secondary | ICD-10-CM

## 2021-02-04 ENCOUNTER — Ambulatory Visit: Payer: Medicare HMO | Admitting: Internal Medicine

## 2021-02-22 ENCOUNTER — Other Ambulatory Visit: Payer: Self-pay | Admitting: Internal Medicine

## 2021-02-22 DIAGNOSIS — E1122 Type 2 diabetes mellitus with diabetic chronic kidney disease: Secondary | ICD-10-CM

## 2021-02-23 ENCOUNTER — Ambulatory Visit: Payer: Medicare HMO | Admitting: Internal Medicine

## 2021-03-31 ENCOUNTER — Ambulatory Visit: Payer: Medicare HMO | Admitting: Internal Medicine

## 2021-04-11 ENCOUNTER — Other Ambulatory Visit: Payer: Self-pay | Admitting: Internal Medicine

## 2021-04-11 DIAGNOSIS — E1122 Type 2 diabetes mellitus with diabetic chronic kidney disease: Secondary | ICD-10-CM

## 2021-04-19 ENCOUNTER — Ambulatory Visit: Payer: Medicare HMO | Admitting: Internal Medicine

## 2021-04-27 ENCOUNTER — Other Ambulatory Visit: Payer: Self-pay | Admitting: Internal Medicine

## 2021-04-27 ENCOUNTER — Other Ambulatory Visit: Payer: Self-pay

## 2021-04-27 ENCOUNTER — Ambulatory Visit (INDEPENDENT_AMBULATORY_CARE_PROVIDER_SITE_OTHER): Payer: Medicare HMO | Admitting: Internal Medicine

## 2021-04-27 ENCOUNTER — Encounter: Payer: Self-pay | Admitting: Internal Medicine

## 2021-04-27 VITALS — BP 120/62 | HR 59 | Temp 97.9°F | Ht 64.0 in | Wt 169.4 lb

## 2021-04-27 DIAGNOSIS — Z13818 Encounter for screening for other digestive system disorders: Secondary | ICD-10-CM

## 2021-04-27 DIAGNOSIS — E1122 Type 2 diabetes mellitus with diabetic chronic kidney disease: Secondary | ICD-10-CM | POA: Diagnosis not present

## 2021-04-27 DIAGNOSIS — I152 Hypertension secondary to endocrine disorders: Secondary | ICD-10-CM | POA: Diagnosis not present

## 2021-04-27 DIAGNOSIS — E1159 Type 2 diabetes mellitus with other circulatory complications: Secondary | ICD-10-CM

## 2021-04-27 DIAGNOSIS — Z1329 Encounter for screening for other suspected endocrine disorder: Secondary | ICD-10-CM | POA: Diagnosis not present

## 2021-04-27 DIAGNOSIS — Z0000 Encounter for general adult medical examination without abnormal findings: Secondary | ICD-10-CM | POA: Diagnosis not present

## 2021-04-27 DIAGNOSIS — R7989 Other specified abnormal findings of blood chemistry: Secondary | ICD-10-CM

## 2021-04-27 DIAGNOSIS — D649 Anemia, unspecified: Secondary | ICD-10-CM | POA: Diagnosis not present

## 2021-04-27 DIAGNOSIS — I10 Essential (primary) hypertension: Secondary | ICD-10-CM

## 2021-04-27 LAB — CBC WITH DIFFERENTIAL/PLATELET
Basophils Absolute: 0 10*3/uL (ref 0.0–0.1)
Basophils Relative: 0.4 % (ref 0.0–3.0)
Eosinophils Absolute: 0.1 10*3/uL (ref 0.0–0.7)
Eosinophils Relative: 1.3 % (ref 0.0–5.0)
HCT: 34.3 % — ABNORMAL LOW (ref 36.0–46.0)
Hemoglobin: 11.3 g/dL — ABNORMAL LOW (ref 12.0–15.0)
Lymphocytes Relative: 22.2 % (ref 12.0–46.0)
Lymphs Abs: 1.5 10*3/uL (ref 0.7–4.0)
MCHC: 32.8 g/dL (ref 30.0–36.0)
MCV: 91.6 fl (ref 78.0–100.0)
Monocytes Absolute: 0.5 10*3/uL (ref 0.1–1.0)
Monocytes Relative: 7.9 % (ref 3.0–12.0)
Neutro Abs: 4.6 10*3/uL (ref 1.4–7.7)
Neutrophils Relative %: 68.2 % (ref 43.0–77.0)
Platelets: 222 10*3/uL (ref 150.0–400.0)
RBC: 3.75 Mil/uL — ABNORMAL LOW (ref 3.87–5.11)
RDW: 14.6 % (ref 11.5–15.5)
WBC: 6.8 10*3/uL (ref 4.0–10.5)

## 2021-04-27 LAB — COMPREHENSIVE METABOLIC PANEL
ALT: 11 U/L (ref 0–35)
AST: 17 U/L (ref 0–37)
Albumin: 4.4 g/dL (ref 3.5–5.2)
Alkaline Phosphatase: 61 U/L (ref 39–117)
BUN: 17 mg/dL (ref 6–23)
CO2: 29 mEq/L (ref 19–32)
Calcium: 9.5 mg/dL (ref 8.4–10.5)
Chloride: 105 mEq/L (ref 96–112)
Creatinine, Ser: 1.42 mg/dL — ABNORMAL HIGH (ref 0.40–1.20)
GFR: 37.06 mL/min — ABNORMAL LOW (ref >60.00)
Glucose, Bld: 66 mg/dL — ABNORMAL LOW (ref 70–99)
Potassium: 3.4 mEq/L — ABNORMAL LOW (ref 3.5–5.1)
Sodium: 143 mEq/L (ref 135–145)
Total Bilirubin: 0.6 mg/dL (ref 0.2–1.2)
Total Protein: 7 g/dL (ref 6.0–8.3)

## 2021-04-27 LAB — LIPID PANEL
Cholesterol: 127 mg/dL (ref 0–200)
HDL: 56.2 mg/dL (ref >39.00)
LDL Cholesterol: 59 mg/dL (ref 0–99)
NonHDL: 70.67
Total CHOL/HDL Ratio: 2
Triglycerides: 60 mg/dL (ref 0.0–149.0)
VLDL: 12 mg/dL (ref 0.0–40.0)

## 2021-04-27 LAB — TSH: TSH: 1.95 u[IU]/mL (ref 0.35–5.50)

## 2021-04-27 LAB — HEMOGLOBIN A1C: Hgb A1c MFr Bld: 6.1 % (ref 4.6–6.5)

## 2021-04-27 MED ORDER — TELMISARTAN 20 MG PO TABS: 20.0000 mg | ORAL_TABLET | Freq: Every day | ORAL | 3 refills | Status: DC

## 2021-04-27 MED ORDER — GLIPIZIDE-METFORMIN HCL 5-500 MG PO TABS: ORAL_TABLET | ORAL | 3 refills | Status: DC

## 2021-04-27 MED ORDER — TRUE METRIX BLOOD GLUCOSE TEST VI STRP: ORAL_STRIP | 3 refills | Status: DC

## 2021-04-27 NOTE — Progress Notes (Signed)
Chief Complaint  Patient presents with   Follow-up   Annual  1. DM 2/Htn contorlled on 5 mg qd micards 40 mg qd hld lipitor 40 mg qhs on metaglip 5-100 2 tabs in am and 1 qhs fasting for labs today  Eye exam due at [facility] denies all HM other than labs even foot exam today  Review of Systems  Constitutional:  Negative for weight loss.  HENT:  Negative for hearing loss.   Eyes:  Negative for blurred vision.  Respiratory:  Negative for shortness of breath.   Cardiovascular:  Negative for chest pain.  Gastrointestinal:  Negative for abdominal pain.  Musculoskeletal:  Negative for falls and joint pain.  Skin:  Negative for rash.  Neurological:  Negative for headaches.  Psychiatric/Behavioral:  Negative for depression.   Past Medical History:  Diagnosis Date   Arthritis    knees   Chronic kidney disease    Diabetes mellitus without complication (HCC)    Hyperlipidemia    Hypertension    Obesity    Past Surgical History:  Procedure Laterality Date   EYE SURGERY     b/l cataract 2004    Family History  Problem Relation Age of Onset   Arthritis Sister    Hypertension Sister    Hyperlipidemia Sister    Diabetes Son    Mental illness Son        bipolar/cognitive deficit    Hyperlipidemia Son    Hypertension Son    Arthritis Sister    Hypertension Sister    Hyperlipidemia Sister    Arthritis Sister    Hypertension Sister    Hyperlipidemia Sister    Arthritis Sister    Hyperlipidemia Sister    Hypertension Sister    Cancer Mother        colon cancer    Arthritis Mother    Cancer Father        lung, smoker   Social History   Socioeconomic History   Marital status: Unknown    Spouse name: Not on file   Number of children: Not on file   Years of education: Not on file   Highest education level: Not on file  Occupational History   Not on file  Tobacco Use   Smoking status: Never   Smokeless tobacco: Never  Substance and Sexual Activity   Alcohol use: No    Drug use: No   Sexual activity: Never    Partners: Male  Other Topics Concern   Not on file  Social History Narrative   No etoh, drugs, never smoker    Walks her dog    No guns, wears seatbelt    Lives with son age 36 as of 08/2019 had her age 39   Used to be Cytogeneticist    Social Determinants of Corporate investment banker Strain: Low Risk    Difficulty of Paying Living Expenses: Not hard at all  Food Insecurity: No Food Insecurity   Worried About Programme researcher, broadcasting/film/video in the Last Year: Never true   Barista in the Last Year: Never true  Transportation Needs: No Transportation Needs   Lack of Transportation (Medical): No   Lack of Transportation (Non-Medical): No  Physical Activity: Not on file  Stress: No Stress Concern Present   Feeling of Stress : Not at all  Social Connections: Unknown   Frequency of Communication with Friends and Family: More than three times a week   Frequency of Social  Gatherings with Friends and Family: Not on file   Attends Religious Services: Not on file   Active Member of Clubs or Organizations: Not on file   Attends Banker Meetings: Not on file   Marital Status: Not on file  Intimate Partner Violence: Not At Risk   Fear of Current or Ex-Partner: No   Emotionally Abused: No   Physically Abused: No   Sexually Abused: No   Current Meds  Medication Sig   amLODipine (NORVASC) 5 MG tablet Take 1 tablet (5 mg total) by mouth daily. Do not cut in half   aspirin EC 81 MG tablet Take 1 tablet (81 mg total) by mouth daily.   atorvastatin (LIPITOR) 40 MG tablet Take 1 tablet (40 mg total) by mouth daily at 6 PM.   Cholecalciferol (VITAMIN D3 PO) Take 2,000 Units by mouth daily.   glipiZIDE-metformin (METAGLIP) 5-500 MG tablet TAKE 2 TABLETS IN THE MORNING WITH FOOD AND TAKE 1 TABLET AT NIGHT   Multiple Vitamin (MULTIVITAMIN ADULT PO) Take by mouth.   telmisartan (MICARDIS) 40 MG tablet Take 1 tablet (40 mg total) by mouth daily.    TRUEplus Lancets 28G MISC TEST BLOOD GLUCOSE TWICE DAILY AS DIRECTED   [DISCONTINUED] TRUE METRIX BLOOD GLUCOSE TEST test strip Bid E11.9   No Known Allergies No results found for this or any previous visit (from the past 2160 hour(s)). Objective  Body mass index is 29.08 kg/m. Wt Readings from Last 3 Encounters:  04/27/21 169 lb 6.4 oz (76.8 kg)  09/14/20 167 lb (75.8 kg)  04/01/20 167 lb 12.8 oz (76.1 kg)   Temp Readings from Last 3 Encounters:  04/27/21 97.9 F (36.6 C) (Oral)  04/01/20 98.2 F (36.8 C) (Oral)  09/05/18 98.3 F (36.8 C) (Oral)   BP Readings from Last 3 Encounters:  04/27/21 120/62  04/01/20 106/66  09/05/18 122/64   Pulse Readings from Last 3 Encounters:  04/27/21 (!) 59  04/01/20 66  09/05/18 83    Physical Exam Vitals and nursing note reviewed.  Constitutional:      Appearance: Normal appearance. She is well-developed, well-groomed and overweight.  HENT:     Head: Normocephalic and atraumatic.  Eyes:     Conjunctiva/sclera: Conjunctivae normal.     Pupils: Pupils are equal, round, and reactive to light.  Cardiovascular:     Rate and Rhythm: Normal rate and regular rhythm.     Heart sounds: Normal heart sounds. No murmur heard. Pulmonary:     Effort: Pulmonary effort is normal.     Breath sounds: Normal breath sounds.  Abdominal:     General: Abdomen is flat. Bowel sounds are normal.  Skin:    General: Skin is warm and dry.  Neurological:     General: No focal deficit present.     Mental Status: She is alert and oriented to person, place, and time. Mental status is at baseline.     Gait: Gait normal.  Psychiatric:        Attention and Perception: Attention and perception normal.        Mood and Affect: Mood and affect normal.        Speech: Speech normal.        Behavior: Behavior normal. Behavior is cooperative.        Thought Content: Thought content normal.        Cognition and Memory: Cognition and memory normal.        Judgment:  Judgment normal.  Assessment  Plan  Hypertension associated with diabetes (HCC) - Plan: Comprehensive metabolic panel, Lipid panel, CBC with Differential/Platelet, Urinalysis, Routine w reflex microscopic, Microalbumin / creatinine urine ratio, Hemoglobin A1c Norvasc 5 mg micardis 40 mg qd lipitor 40 mg qhs  Metaclip 5-500 2 pills in am and 1 pill qhs   Anemia, unspecified type - Plan: CBC with Differential/Platelet, Iron, TIBC and Ferritin Panel  Encounter for screening for other digestive system disorders - Plan: Hepatitis C antibody  Thyroid disorder screen - Plan: TSH  HM Flu shot utd prevnar had, pna 23 due pt declines 04/27/21 Shingrix 04/27/21  Tdap disc'ed will give Rx at f/u declines for now as of 09/09/19  covid shots 3/3 rec D3 2000 IU qd    Declines mammo, pap, colonoscopy, cologaurd ( mother died of colon cancer not candidate) consider fobt stool tests in future (pt to have through insurance mail order and will fax me result of fobt) disc DEXA pt declines prev and today 04/27/21    Declines all health maintenance as of 09/09/19 and 04/01/20 and 04/27/21 and denied prior to this as well   -have discussed with patient multiple times   Rec healthy diet and exrecise   Provider: Dr. French Ana McLean-Scocuzza-Internal Medicine

## 2021-04-27 NOTE — Patient Instructions (Addendum)
Consider 4th pfizer vaccine   Call [facility] and please sch eye exam

## 2021-04-28 ENCOUNTER — Other Ambulatory Visit: Payer: Self-pay | Admitting: Internal Medicine

## 2021-04-28 DIAGNOSIS — N3000 Acute cystitis without hematuria: Secondary | ICD-10-CM

## 2021-04-28 LAB — URINALYSIS, ROUTINE W REFLEX MICROSCOPIC
Bilirubin Urine: NEGATIVE
Glucose, UA: NEGATIVE
Hgb urine dipstick: NEGATIVE
Nitrite: NEGATIVE
Specific Gravity, Urine: 1.04 — ABNORMAL HIGH (ref 1.001–1.035)
WBC, UA: >=60 /HPF — AB (ref 0–5)
pH: <=5 (ref 5.0–8.0)

## 2021-04-28 LAB — MICROALBUMIN / CREATININE URINE RATIO
Creatinine, Urine: 571 mg/dL — ABNORMAL HIGH (ref 20–275)
Microalb Creat Ratio: 6 mcg/mg creat (ref <30)
Microalb, Ur: 3.7 mg/dL

## 2021-04-28 LAB — IRON,TIBC AND FERRITIN PANEL
%SAT: 20 % (calc) (ref 16–45)
Ferritin: 104 ng/mL (ref 16–288)
Iron: 47 ug/dL (ref 45–160)
TIBC: 237 mcg/dL (calc) — ABNORMAL LOW (ref 250–450)

## 2021-04-28 LAB — MICROSCOPIC MESSAGE

## 2021-04-28 LAB — HEPATITIS C ANTIBODY
Hepatitis C Ab: NONREACTIVE
SIGNAL TO CUT-OFF: 0.01 (ref <1.00)

## 2021-05-09 ENCOUNTER — Telehealth: Payer: Self-pay

## 2021-05-09 NOTE — Telephone Encounter (Signed)
Ebony Reese, CMA  05/03/2021 10:27 AM EDT Back to Top     LMTCB   Ebony Reese, [place]  04/29/2021  1:40 PM EDT      LMTCB   [name], MD  04/28/2021  5:38 PM EDT      Still want urine culture despite no sx's just to be sure given dirty urinethank you Order and sch  Thank you  Letter sent to call in

## 2021-05-09 NOTE — Telephone Encounter (Signed)
-----   Message from Bevelyn Buckles, MD sent at 04/28/2021 12:36 PM EDT ----- Call and schedule urine culture    ----- Message ----- From: Clearnce Sorrel, RMA Sent: 04/28/2021   9:30 AM EDT To: Warden Fillers, CMA, #  No, We cannot add  on a culture, Pt didn't leave enough urine. ----- Message ----- From: McLean-Scocuzza, Pasty Spillers, MD Sent: 04/28/2021   9:16 AM EDT To: Clearnce Sorrel, RMA, Warden Fillers, CMA, #  Can we add on urine culture ?

## 2021-05-12 ENCOUNTER — Other Ambulatory Visit: Payer: Medicare HMO

## 2021-05-18 ENCOUNTER — Other Ambulatory Visit: Payer: Self-pay | Admitting: Internal Medicine

## 2021-05-18 DIAGNOSIS — E1122 Type 2 diabetes mellitus with diabetic chronic kidney disease: Secondary | ICD-10-CM

## 2021-05-19 ENCOUNTER — Other Ambulatory Visit (INDEPENDENT_AMBULATORY_CARE_PROVIDER_SITE_OTHER): Payer: Medicare HMO

## 2021-05-19 ENCOUNTER — Other Ambulatory Visit: Payer: Self-pay

## 2021-05-19 DIAGNOSIS — N3000 Acute cystitis without hematuria: Secondary | ICD-10-CM

## 2021-05-19 DIAGNOSIS — I10 Essential (primary) hypertension: Secondary | ICD-10-CM | POA: Diagnosis not present

## 2021-05-19 DIAGNOSIS — E1122 Type 2 diabetes mellitus with diabetic chronic kidney disease: Secondary | ICD-10-CM | POA: Diagnosis not present

## 2021-05-19 DIAGNOSIS — R7989 Other specified abnormal findings of blood chemistry: Secondary | ICD-10-CM | POA: Diagnosis not present

## 2021-05-19 LAB — BASIC METABOLIC PANEL
BUN: 15 mg/dL (ref 6–23)
CO2: 29 mEq/L (ref 19–32)
Calcium: 9.2 mg/dL (ref 8.4–10.5)
Chloride: 105 mEq/L (ref 96–112)
Creatinine, Ser: 1.24 mg/dL — ABNORMAL HIGH (ref 0.40–1.20)
GFR: 43.59 mL/min — ABNORMAL LOW (ref >60.00)
Glucose, Bld: 90 mg/dL (ref 70–99)
Potassium: 3.2 mEq/L — ABNORMAL LOW (ref 3.5–5.1)
Sodium: 143 mEq/L (ref 135–145)

## 2021-05-20 LAB — URINE CULTURE
MICRO NUMBER:: 12202160
SPECIMEN QUALITY:: ADEQUATE

## 2021-05-23 ENCOUNTER — Telehealth: Payer: Self-pay

## 2021-05-23 ENCOUNTER — Other Ambulatory Visit: Payer: Self-pay | Admitting: Internal Medicine

## 2021-05-23 DIAGNOSIS — E876 Hypokalemia: Secondary | ICD-10-CM

## 2021-05-23 MED ORDER — POTASSIUM CHLORIDE CRYS ER 10 MEQ PO TBCR: 10.0000 meq | EXTENDED_RELEASE_TABLET | Freq: Every day | ORAL | 3 refills | Status: DC

## 2021-05-23 NOTE — Telephone Encounter (Signed)
LMTCB in regards to lab results.  

## 2021-07-14 ENCOUNTER — Other Ambulatory Visit: Payer: Self-pay | Admitting: Internal Medicine

## 2021-07-14 DIAGNOSIS — I10 Essential (primary) hypertension: Secondary | ICD-10-CM

## 2021-07-14 DIAGNOSIS — E785 Hyperlipidemia, unspecified: Secondary | ICD-10-CM

## 2021-07-18 ENCOUNTER — Other Ambulatory Visit: Payer: Self-pay | Admitting: Internal Medicine

## 2021-07-18 DIAGNOSIS — I10 Essential (primary) hypertension: Secondary | ICD-10-CM

## 2021-07-21 ENCOUNTER — Telehealth: Payer: Self-pay | Admitting: Internal Medicine

## 2021-07-21 DIAGNOSIS — E1122 Type 2 diabetes mellitus with diabetic chronic kidney disease: Secondary | ICD-10-CM

## 2021-07-21 DIAGNOSIS — I10 Essential (primary) hypertension: Secondary | ICD-10-CM

## 2021-07-21 MED ORDER — ASPIRIN EC 81 MG PO TBEC: 81.0000 mg | DELAYED_RELEASE_TABLET | Freq: Every day | ORAL | 3 refills | Status: DC

## 2021-07-21 NOTE — Telephone Encounter (Signed)
Patient called in stating that her pharmacy told her to contact our office for a refill on her aspirin EC 81 MG tablet.Please send to the [facility] Mail order pharmacy.

## 2021-07-21 NOTE — Telephone Encounter (Signed)
Medication has been refilled.

## 2021-08-29 ENCOUNTER — Other Ambulatory Visit: Payer: Self-pay | Admitting: Internal Medicine

## 2021-08-29 DIAGNOSIS — E1122 Type 2 diabetes mellitus with diabetic chronic kidney disease: Secondary | ICD-10-CM

## 2021-09-15 ENCOUNTER — Ambulatory Visit (INDEPENDENT_AMBULATORY_CARE_PROVIDER_SITE_OTHER): Payer: Medicare HMO

## 2021-09-15 VITALS — Ht 64.0 in | Wt 169.0 lb

## 2021-09-15 DIAGNOSIS — Z0000 Encounter for general adult medical examination without abnormal findings: Secondary | ICD-10-CM

## 2021-09-15 NOTE — Patient Instructions (Addendum)
  Ebony Reese , Thank you for taking time to come for your Medicare Wellness Visit. I appreciate your ongoing commitment to your health goals. Please review the following plan we discussed and let me know if I can assist you in the future.   These are the goals we discussed:  Goals       Patient Stated     Healthy Lifestyle (pt-stated)      Stay active Compliant with medications Drink plenty of fluids Eat healthy        This is a list of the screening recommended for you and due dates:  Health Maintenance  Topic Date Due   Complete foot exam   04/01/2021   Eye exam for diabetics  09/15/2021*   Zoster (Shingles) Vaccine (1 of 2) 12/14/2021*   Flu Shot  01/13/2022*   COVID-19 Vaccine (4 - Booster for Pfizer series) 04/15/2022*   Pneumonia Vaccine (2 - PPSV23 if available, else PCV20) 09/15/2022*   DEXA scan (bone density measurement)  09/15/2022*   Colon Cancer Screening  09/15/2022*   Tetanus Vaccine  09/15/2022*   Mammogram  09/19/2022*   Hemoglobin A1C  10/28/2021   Hepatitis C Screening: USPSTF Recommendation to screen - Ages 18-79 yo.  Completed   HPV Vaccine  Aged Out  *Topic was postponed. The date shown is not the original due date.

## 2021-09-15 NOTE — Progress Notes (Signed)
Subjective:   Ebony Reese is a 70 y.o. female who presents for Medicare Annual (Subsequent) preventive examination.  Review of Systems    No ROS.  Medicare Wellness Virtual Visit.  Visual/audio telehealth visit, UTA vital signs.   See social history for additional risk factors.   Cardiac Risk Factors include: advanced age (>59men, >75 women)     Objective:    Today's Vitals   09/15/21 0907  Weight: 169 lb (76.7 kg)  Height: 5\' 4"  (1.626 m)   Body mass index is 29.01 kg/m.  Advanced Directives 09/15/2021 09/14/2020 09/09/2019 09/05/2018  Does Patient Have a Medical Advance Directive? No No No No  Would patient like information on creating a medical advance directive? No - Patient declined No - Patient declined No - Patient declined No - Patient declined    Current Medications (verified) Outpatient Encounter Medications as of 09/15/2021  Medication Sig   amLODipine (NORVASC) 5 MG tablet TAKE 1 TABLET (5 MG TOTAL) BY MOUTH DAILY. DO NOT CUT IN HALF. STOP 10MG  DOSE AND TAKE ONE WHOLE 5MG  DOSE   aspirin EC 81 MG tablet Take 1 tablet (81 mg total) by mouth daily.   atorvastatin (LIPITOR) 40 MG tablet TAKE 1 TABLET (40 MG TOTAL) BY MOUTH DAILY AT 6 PM. (STOP ZOCOR 20MG )   Cholecalciferol (VITAMIN D3 PO) Take 2,000 Units by mouth daily.   glipiZIDE-metformin (METAGLIP) 5-500 MG tablet TAKE 2 TABLETS IN THE MORNING WITH FOOD AND TAKE 1 TABLET AT NIGHT   Multiple Vitamin (MULTIVITAMIN ADULT PO) Take by mouth.   potassium chloride SA (KLOR-CON) 10 MEQ tablet Take 1 tablet (10 mEq total) by mouth daily.   telmisartan (MICARDIS) 20 MG tablet Take 1 tablet (20 mg total) by mouth daily. Take 1/2 pill of 40 mg and once finished take 1 20 mg pill.   telmisartan (MICARDIS) 40 MG tablet TAKE 1 TABLET (40 MG TOTAL) BY MOUTH DAILY.   TRUE METRIX BLOOD GLUCOSE TEST test strip Bid E11.9   TRUEplus Lancets 28G MISC TEST BLOOD GLUCOSE TWICE DAILY AS DIRECTED   No facility-administered encounter  medications on file as of 09/15/2021.    Allergies (verified) Patient has no known allergies.   History: Past Medical History:  Diagnosis Date   Arthritis    knees   Chronic kidney disease    Diabetes mellitus without complication (HCC)    Hyperlipidemia    Hypertension    Obesity    Past Surgical History:  Procedure Laterality Date   EYE SURGERY     b/l cataract 2004    Family History  Problem Relation Age of Onset   Arthritis Sister    Hypertension Sister    Hyperlipidemia Sister    Diabetes Son    Mental illness Son        bipolar/cognitive deficit    Hyperlipidemia Son    Hypertension Son    Arthritis Sister    Hypertension Sister    Hyperlipidemia Sister    Arthritis Sister    Hypertension Sister    Hyperlipidemia Sister    Arthritis Sister    Hyperlipidemia Sister    Hypertension Sister    Cancer Mother        colon cancer    Arthritis Mother    Cancer Father        lung, smoker   Social History   Socioeconomic History   Marital status: Unknown    Spouse name: Not on file   Number of children: Not on file  Years of education: Not on file   Highest education level: Not on file  Occupational History   Not on file  Tobacco Use   Smoking status: Never   Smokeless tobacco: Never  Substance and Sexual Activity   Alcohol use: No   Drug use: No   Sexual activity: Never    Partners: Male  Other Topics Concern   Not on file  Social History Narrative   No etoh, drugs, never smoker    Walks her dog    No guns, wears seatbelt    Lives with son age 78 as of 08/2019 had her age 72   Used to be Cytogeneticist    Social Determinants of Corporate investment banker Strain: Not on file  Food Insecurity: Not on file  Transportation Needs: Not on file  Physical Activity: Not on file  Stress: Not on file  Social Connections: Not on file    Tobacco Counseling Counseling given: Not Answered   Clinical Intake:  Pre-visit preparation completed:  Yes    Foot exam- due. Denies wounds, numbness, tingling.   Monitors BS daily. Today FBS 120  Declines CCM or diabetic nutritional follow up.   Denies wounds on body that are not healing.     Diabetes: Yes (Followed by pcp)  How often do you need to have someone help you when you read instructions, pamphlets, or other written materials from your doctor or pharmacy?: 1 - Never    Interpreter Needed?: No      Activities of Daily Living In your present state of health, do you have any difficulty performing the following activities: 09/15/2021  Hearing? N  Vision? N  Difficulty concentrating or making decisions? N  Walking or climbing stairs? N  Dressing or bathing? N  Doing errands, shopping? N  Preparing Food and eating ? N  Using the Toilet? N  In the past six months, have you accidently leaked urine? N  Do you have problems with loss of bowel control? N  Managing your Medications? N  Managing your Finances? N  Housekeeping or managing your Housekeeping? N  Some recent data might be hidden    Patient Care Team: McLean-Scocuzza, Pasty Spillers, MD as PCP - General (Internal Medicine)  Indicate any recent Medical Services you may have received from other than [facility] providers in the past year (date may be approximate).     Assessment:   This is a routine wellness examination for [name].  Virtual Visit via Telephone Note  I connected with  Ebony Reese on 09/15/21 at  9:00 AM EST by telephone and verified that I am speaking with the correct person using two identifiers.  Location: Patient: home Provider: office Persons participating in the virtual visit: patient/Nurse Health Advisor   I discussed the limitations, risks, security and privacy concerns of performing an evaluation and management service by telephone and the availability of in person appointments. The patient expressed understanding and agreed to proceed.  Interactive audio and video telecommunications were  attempted between this nurse and patient, however failed, due to patient having technical difficulties OR patient did not have access to video capability.  We continued and completed visit with audio only.  Some vital signs may be absent or patient reported.   Hearing/Vision screen Hearing Screening - Comments:: Patient is able to hear conversational tones without difficulty. No issues reported. Vision Screening - Comments:: Wears reader lenses  Cataract extraction, bilateral   Dietary issues and exercise activities discussed: Current Exercise  Habits: Home exercise routine, Type of exercise: walking, Intensity: Mild Low carb diet Good water intake   Goals Addressed               This Visit's Progress     Patient Stated     Healthy Lifestyle (pt-stated)        Stay active Compliant with medications Drink plenty of fluids Eat healthy       Depression Screen PHQ 2/9 Scores 09/15/2021 09/14/2020 04/01/2020 04/11/2019 09/05/2018 04/02/2018 12/26/2017  PHQ - 2 Score 0 0 0 0 0 0 0    Fall Risk Fall Risk  09/15/2021 04/27/2021 09/14/2020 04/01/2020 04/11/2019  Falls in the past year? 0 0 0 0 0  Number falls in past yr: 0 0 0 0 0  Injury with Fall? - 0 0 0 0  Follow up Falls evaluation completed Falls evaluation completed Falls evaluation completed Falls evaluation completed Falls evaluation completed    FALL RISK PREVENTION PERTAINING TO THE HOME: Adequate lighting in your home to reduce risk of falls? Yes   ASSISTIVE DEVICES UTILIZED TO PREVENT FALLS: Use of a cane, walker or w/c? No   TIMED UP AND GO: Was the test performed? No .   Cognitive Function: Patient is alert and oriented x3.  MMSE - Mini Mental State Exam 09/05/2018  Orientation to time 5  Orientation to Place 5  Registration 3  Attention/ Calculation 2  Attention/Calculation-comments Difficulty with calculation  Recall 0  Recall-comments 0/3 recalled  Language- name 2 objects 2  Language- repeat 1   Language- follow 3 step command 3  Language- read & follow direction 1  Write a sentence 1  Copy design 1  Total score 24     6CIT Screen 09/14/2020 09/09/2019 09/05/2018  What Year? 0 points 0 points 0 points  What month? 0 points 0 points 0 points  What time? - 0 points 0 points  Count back from 20 - 0 points 0 points  Months in reverse 0 points 2 points 4 points  Repeat phrase - 2 points -  Total Score - 4 -    Immunizations Immunization History  Administered Date(s) Administered   Fluad Quad(high Dose 65+) 07/12/2019   Influenza, High Dose Seasonal PF 09/05/2018, 08/12/2020   PFIZER(Purple Top)SARS-COV-2 Vaccination 12/03/2019, 12/24/2019, 07/23/2020   Pneumococcal Conjugate-13 12/26/2017    TDAP status: Due, Education has been provided regarding the importance of this vaccine. Advised may receive this vaccine at local pharmacy or Health Dept. Aware to provide a copy of the vaccination record if obtained from local pharmacy or Health Dept. Verbalized acceptance and understanding.  Flu Vaccine status: Due, Education has been provided regarding the importance of this vaccine. Advised may receive this vaccine at local pharmacy or Health Dept. Aware to provide a copy of the vaccination record if obtained from local pharmacy or Health Dept. Verbalized acceptance and understanding.  Pneumococcal vaccine status: Declined,  Education has been provided regarding the importance of this vaccine but patient still declined. Advised may receive this vaccine at local pharmacy or Health Dept. Aware to provide a copy of the vaccination record if obtained from local pharmacy or Health Dept. Verbalized acceptance and understanding.    Shingrix Completed?: No.    Education has been provided regarding the importance of this vaccine. Patient has been advised to call insurance company to determine out of pocket expense if they have not yet received this vaccine. Advised may also receive vaccine at  local pharmacy or Health  Dept. Verbalized acceptance and understanding.  Screening Tests Health Maintenance  Topic Date Due   FOOT EXAM  04/01/2021   OPHTHALMOLOGY EXAM  09/15/2021 (Originally 03/27/1961)   Zoster Vaccines- Shingrix (1 of 2) 12/14/2021 (Originally 03/27/1999)   INFLUENZA VACCINE  01/13/2022 (Originally 05/16/2021)   COVID-19 Vaccine (4 - Booster for Pfizer series) 04/15/2022 (Originally 09/17/2020)   Pneumonia Vaccine 59+ Years old (2 - PPSV23 if available, else PCV20) 09/15/2022 (Originally 12/27/2018)   DEXA SCAN  09/15/2022 (Originally 03/26/2014)   COLONOSCOPY (Pts 45-33yrs Insurance coverage will need to be confirmed)  09/15/2022 (Originally 03/26/1994)   TETANUS/TDAP  09/15/2022 (Originally 03/26/1968)   MAMMOGRAM  09/19/2022 (Originally 03/27/1999)   HEMOGLOBIN A1C  10/28/2021   Hepatitis C Screening  Completed   HPV VACCINES  Aged Out    Health Maintenance  Health Maintenance Due  Topic Date Due   FOOT EXAM  04/01/2021   Colonoscopy- declined  Mammogram- declined  Bone density- declined  Lung Cancer Screening: (Low Dose CT Chest recommended if Age 28-80 years, 30 pack-year currently smoking OR have quit w/in 15years.) does not qualify.   Vision Screening: Recommended annual ophthalmology exams for early detection of glaucoma and other disorders of the eye.  Dental Screening: Recommended annual dental exams for proper oral hygiene  Community Resource Referral / Chronic Care Management: CRR required this visit?  No   CCM required this visit?  No      Plan:   Keep all routine maintenance appointments.   I have personally reviewed and noted the following in the patient's chart:   Medical and social history Use of alcohol, tobacco or illicit drugs  Current medications and supplements including opioid prescriptions. Not taking opioid. Functional ability and status Nutritional status Physical activity Advanced directives List of other  physicians Hospitalizations, surgeries, and ER visits in previous 12 months Vitals Screenings to include cognitive, depression, and falls Referrals and appointments  In addition, I have reviewed and discussed with patient certain preventive protocols, quality metrics, and best practice recommendations. A written personalized care plan for preventive services as well as general preventive health recommendations were provided to patient.     Ashok Pall, LPN   16/10/958

## 2021-10-28 ENCOUNTER — Ambulatory Visit: Payer: Medicare HMO | Admitting: Internal Medicine

## 2021-12-02 ENCOUNTER — Ambulatory Visit: Payer: Medicare HMO | Admitting: Internal Medicine

## 2021-12-09 ENCOUNTER — Ambulatory Visit: Payer: Medicare HMO | Admitting: Internal Medicine

## 2021-12-20 ENCOUNTER — Encounter: Payer: Self-pay | Admitting: Internal Medicine

## 2021-12-20 ENCOUNTER — Other Ambulatory Visit: Payer: Self-pay | Admitting: Internal Medicine

## 2021-12-20 ENCOUNTER — Other Ambulatory Visit: Payer: Self-pay

## 2021-12-20 ENCOUNTER — Ambulatory Visit (INDEPENDENT_AMBULATORY_CARE_PROVIDER_SITE_OTHER): Payer: Medicare HMO | Admitting: Internal Medicine

## 2021-12-20 VITALS — BP 130/78 | HR 67 | Temp 98.2°F | Ht 64.0 in | Wt 171.2 lb

## 2021-12-20 DIAGNOSIS — R300 Dysuria: Secondary | ICD-10-CM | POA: Diagnosis not present

## 2021-12-20 DIAGNOSIS — N1831 Chronic kidney disease, stage 3a: Secondary | ICD-10-CM | POA: Diagnosis not present

## 2021-12-20 DIAGNOSIS — R809 Proteinuria, unspecified: Secondary | ICD-10-CM

## 2021-12-20 DIAGNOSIS — E1159 Type 2 diabetes mellitus with other circulatory complications: Secondary | ICD-10-CM | POA: Diagnosis not present

## 2021-12-20 DIAGNOSIS — K921 Melena: Secondary | ICD-10-CM

## 2021-12-20 DIAGNOSIS — H5702 Anisocoria: Secondary | ICD-10-CM

## 2021-12-20 DIAGNOSIS — R634 Abnormal weight loss: Secondary | ICD-10-CM | POA: Diagnosis not present

## 2021-12-20 DIAGNOSIS — E1122 Type 2 diabetes mellitus with diabetic chronic kidney disease: Secondary | ICD-10-CM

## 2021-12-20 DIAGNOSIS — I152 Hypertension secondary to endocrine disorders: Secondary | ICD-10-CM

## 2021-12-20 DIAGNOSIS — E559 Vitamin D deficiency, unspecified: Secondary | ICD-10-CM

## 2021-12-20 DIAGNOSIS — Z23 Encounter for immunization: Secondary | ICD-10-CM

## 2021-12-20 DIAGNOSIS — E785 Hyperlipidemia, unspecified: Secondary | ICD-10-CM | POA: Diagnosis not present

## 2021-12-20 DIAGNOSIS — I10 Essential (primary) hypertension: Secondary | ICD-10-CM

## 2021-12-20 LAB — LIPID PANEL
Cholesterol: 145 mg/dL (ref 0–200)
HDL: 72.2 mg/dL (ref >39.00)
LDL Cholesterol: 61 mg/dL (ref 0–99)
NonHDL: 73.19
Total CHOL/HDL Ratio: 2
Triglycerides: 60 mg/dL (ref 0.0–149.0)
VLDL: 12 mg/dL (ref 0.0–40.0)

## 2021-12-20 LAB — CBC WITH DIFFERENTIAL/PLATELET
Basophils Absolute: 0 10*3/uL (ref 0.0–0.1)
Basophils Relative: 0.6 % (ref 0.0–3.0)
Eosinophils Absolute: 0.2 10*3/uL (ref 0.0–0.7)
Eosinophils Relative: 2 % (ref 0.0–5.0)
HCT: 35 % — ABNORMAL LOW (ref 36.0–46.0)
Hemoglobin: 11.6 g/dL — ABNORMAL LOW (ref 12.0–15.0)
Lymphocytes Relative: 22.5 % (ref 12.0–46.0)
Lymphs Abs: 1.7 10*3/uL (ref 0.7–4.0)
MCHC: 33.3 g/dL (ref 30.0–36.0)
MCV: 93.4 fl (ref 78.0–100.0)
Monocytes Absolute: 0.6 10*3/uL (ref 0.1–1.0)
Monocytes Relative: 7.2 % (ref 3.0–12.0)
Neutro Abs: 5.3 10*3/uL (ref 1.4–7.7)
Neutrophils Relative %: 67.7 % (ref 43.0–77.0)
Platelets: 232 10*3/uL (ref 150.0–400.0)
RBC: 3.75 Mil/uL — ABNORMAL LOW (ref 3.87–5.11)
RDW: 14.3 % (ref 11.5–15.5)
WBC: 7.7 10*3/uL (ref 4.0–10.5)

## 2021-12-20 LAB — COMPREHENSIVE METABOLIC PANEL
ALT: 10 U/L (ref 0–35)
AST: 18 U/L (ref 0–37)
Albumin: 4.3 g/dL (ref 3.5–5.2)
Alkaline Phosphatase: 65 U/L (ref 39–117)
BUN: 18 mg/dL (ref 6–23)
CO2: 30 mEq/L (ref 19–32)
Calcium: 9.9 mg/dL (ref 8.4–10.5)
Chloride: 101 mEq/L (ref 96–112)
Creatinine, Ser: 1.41 mg/dL — ABNORMAL HIGH (ref 0.40–1.20)
GFR: 37.21 mL/min — ABNORMAL LOW (ref >60.00)
Glucose, Bld: 77 mg/dL (ref 70–99)
Potassium: 4 mEq/L (ref 3.5–5.1)
Sodium: 139 mEq/L (ref 135–145)
Total Bilirubin: 0.7 mg/dL (ref 0.2–1.2)
Total Protein: 7.2 g/dL (ref 6.0–8.3)

## 2021-12-20 LAB — TSH: TSH: 2.78 u[IU]/mL (ref 0.35–5.50)

## 2021-12-20 LAB — HEMOGLOBIN A1C: Hgb A1c MFr Bld: 6 % (ref 4.6–6.5)

## 2021-12-20 LAB — VITAMIN D 25 HYDROXY (VIT D DEFICIENCY, FRACTURES): VITD: 67.98 ng/mL (ref 30.00–100.00)

## 2021-12-20 MED ORDER — GLIPIZIDE-METFORMIN HCL 5-500 MG PO TABS: 1.0000 | ORAL_TABLET | Freq: Two times a day (BID) | ORAL | 3 refills | Status: DC

## 2021-12-20 MED ORDER — SHINGRIX 50 MCG/0.5ML IM SUSR: 0.5000 mL | Freq: Once | INTRAMUSCULAR | 1 refills | Status: AC

## 2021-12-20 MED ORDER — GLIPIZIDE-METFORMIN HCL 5-500 MG PO TABS: ORAL_TABLET | ORAL | 3 refills | Status: DC

## 2021-12-20 NOTE — Progress Notes (Signed)
Chief Complaint  ?Patient presents with  ? Follow-up  ? ?F/u  ?1. Htn and diabetes on norvasc 5 mg qd, metaglip 5-500 2 pills in the am and 1 pill qhs, micardisc 20 mg qd  ? ?Review of Systems  ?Constitutional:  Negative for weight loss.  ?HENT:  Negative for hearing loss.   ?Eyes:  Negative for blurred vision.  ?Respiratory:  Negative for shortness of breath.   ?Cardiovascular:  Negative for chest pain.  ?Gastrointestinal:  Negative for abdominal pain and blood in stool.  ?Genitourinary:  Negative for dysuria.  ?Musculoskeletal:  Negative for falls and joint pain.  ?Skin:  Negative for rash.  ?Neurological:  Negative for headaches.  ?Psychiatric/Behavioral:  Negative for depression.   ?Past Medical History:  ?Diagnosis Date  ? Arthritis   ? knees  ? Chronic kidney disease   ? Diabetes mellitus without complication ([place])   ? Hyperlipidemia   ? Hypertension   ? Obesity   ? ?Past Surgical History:  ?Procedure Laterality Date  ? EYE SURGERY    ? b/l cataract 2004   ? ?Family History  ?Problem Relation Age of Onset  ? Arthritis Sister   ? Hypertension Sister   ? Hyperlipidemia Sister   ? Diabetes Son   ? Mental illness Son   ?     bipolar/cognitive deficit   ? Hyperlipidemia Son   ? Hypertension Son   ? Arthritis Sister   ? Hypertension Sister   ? Hyperlipidemia Sister   ? Arthritis Sister   ? Hypertension Sister   ? Hyperlipidemia Sister   ? Arthritis Sister   ? Hyperlipidemia Sister   ? Hypertension Sister   ? Cancer Mother   ?     colon cancer   ? Arthritis Mother   ? Cancer Father   ?     lung, smoker  ? ?Social History  ? ?Socioeconomic History  ? Marital status: Unknown  ?  Spouse name: Not on file  ? Number of children: Not on file  ? Years of education: Not on file  ? Highest education level: Not on file  ?Occupational History  ? Not on file  ?Tobacco Use  ? Smoking status: Never  ? Smokeless tobacco: Never  ?Substance and Sexual Activity  ? Alcohol use: No  ? Drug use: No  ? Sexual activity: Never  ?  Partners:  Male  ?Other Topics Concern  ? Not on file  ?Social History Narrative  ? No etoh, drugs, never smoker   ? Walks her dog   ? No guns, wears seatbelt   ? Lives with son age 66 as of 08/2019 had her age 40  ? Used to be Psychologist, clinical   ? ?Social Determinants of Health  ? ?Financial Resource Strain: Not on file  ?Food Insecurity: Not on file  ?Transportation Needs: Not on file  ?Physical Activity: Not on file  ?Stress: Not on file  ?Social Connections: Not on file  ?Intimate Partner Violence: Not on file  ? ?Current Meds  ?Medication Sig  ? amLODipine (NORVASC) 5 MG tablet TAKE 1 TABLET (5 MG TOTAL) BY MOUTH DAILY. DO NOT CUT IN HALF. STOP 10MG  DOSE AND TAKE ONE WHOLE 5MG  DOSE  ? aspirin EC 81 MG tablet Take 1 tablet (81 mg total) by mouth daily.  ? atorvastatin (LIPITOR) 40 MG tablet TAKE 1 TABLET (40 MG TOTAL) BY MOUTH DAILY AT 6 PM. (STOP ZOCOR 20MG )  ? Cholecalciferol (VITAMIN D3 PO) Take 2,000 Units  by mouth daily.  ? Multiple Vitamin (MULTIVITAMIN ADULT PO) Take by mouth.  ? potassium chloride SA (KLOR-CON) 10 MEQ tablet Take 1 tablet (10 mEq total) by mouth daily.  ? telmisartan (MICARDIS) 20 MG tablet Take 1 tablet (20 mg total) by mouth daily. Take 1/2 pill of 40 mg and once finished take 1 20 mg pill.  ? TRUE METRIX BLOOD GLUCOSE TEST test strip Bid E11.9  ? TRUEplus Lancets 28G MISC TEST BLOOD GLUCOSE TWICE DAILY AS DIRECTED  ? Zoster Vaccine Adjuvanted [facility]) injection Inject 0.5 mLs into the muscle once for 1 dose. X 2 doses 2nd dose in 2 months no more than 6 months  ? [DISCONTINUED] glipiZIDE-metformin (METAGLIP) 5-500 MG tablet TAKE 2 TABLETS IN THE MORNING WITH FOOD AND TAKE 1 TABLET AT NIGHT  ? ?No Known Allergies ?No results found for this or any previous visit (from the past 2160 hour(s)). ?Objective  ?Body mass index is 29.39 kg/m?. ?Wt Readings from Last 3 Encounters:  ?12/20/21 171 lb 3.2 oz (77.7 kg)  ?09/15/21 169 lb (76.7 kg)  ?04/27/21 169 lb 6.4 oz (76.8 kg)  ? ?Temp Readings from Last 3  Encounters:  ?12/20/21 98.2 ?F (36.8 ?C) (Oral)  ?04/27/21 97.9 ?F (36.6 ?C) (Oral)  ?04/01/20 98.2 ?F (36.8 ?C) (Oral)  ? ?BP Readings from Last 3 Encounters:  ?12/20/21 130/78  ?04/27/21 120/62  ?04/01/20 106/66  ? ?Pulse Readings from Last 3 Encounters:  ?12/20/21 67  ?04/27/21 (!) 59  ?04/01/20 66  ? ? ?Physical Exam ?Vitals and nursing note reviewed.  ?Constitutional:   ?   Appearance: Normal appearance. She is well-developed and well-groomed.  ?HENT:  ?   Head: Normocephalic and atraumatic.  ?Eyes:  ?   Conjunctiva/sclera: Conjunctivae normal.  ?   Pupils: Pupils are equal, round, and reactive to light.  ?   Comments: Left pupil reactive but larger than the right  ?B/l perrla  ?Cardiovascular:  ?   Rate and Rhythm: Normal rate and regular rhythm.  ?   Heart sounds: Normal heart sounds. No murmur heard. ?Pulmonary:  ?   Effort: Pulmonary effort is normal.  ?   Breath sounds: Normal breath sounds.  ?Abdominal:  ?   General: Abdomen is flat. Bowel sounds are normal.  ?   Tenderness: There is no abdominal tenderness.  ?Musculoskeletal:     ?   General: No tenderness.  ?Skin: ?   General: Skin is warm and dry.  ?Neurological:  ?   General: No focal deficit present.  ?   Mental Status: She is alert and oriented to person, place, and time. Mental status is at baseline.  ?   Cranial Nerves: Cranial nerves 2-12 are intact.  ?   Motor: Motor function is intact.  ?   Coordination: Coordination is intact.  ?   Gait: Gait is intact.  ?Psychiatric:     ?   Attention and Perception: Attention and perception normal.     ?   Mood and Affect: Mood and affect normal.     ?   Speech: Speech normal.     ?   Behavior: Behavior normal. Behavior is cooperative.     ?   Thought Content: Thought content normal.     ?   Cognition and Memory: Cognition and memory normal.     ?   Judgment: Judgment normal.  ? ? ?Assessment  ?Plan  ?Hypertension associated with diabetes ([place]) - Plan: Comprehensive metabolic panel, Lipid panel, CBC with  Differential/Platelet, Hemoglobin A1c ?norvasc 5 mg qd, metaglip 5-500 2 pills in the am and 1 pill qhs, micardisc 20 mg qd  ?Lipitor 40  ?Declines foot exam  ? ?Stage 3a chronic kidney disease ([place]) ? ? ?Blood in stool testing since declines colonoscopy- Plan: Fecal occult blood, imunochemical([facility])  ? 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 ?HM ?Flu shot utd ?prevnar had, pna 23 due pt declines 04/27/21 ?Shingrix Rx 12/20/21 ?Tdap disc'ed will give Rx at f/u declines for now as of 09/09/19  ?covid shots 3/3 ?rec D3 2000 IU qd  ?  ?Declines mammo, pap, colonoscopy, cologaurd ( mother died of colon cancer not candidate) consider fobt stool tests in future (pt to have through insurance mail order and will fax me result of fobt) ?disc DEXA pt declines prev and today 04/27/21, 12/20/21 ?  ?Declines all health maintenance as of 09/09/19 and 04/01/20 and 04/27/21, 12/20/21 and denied prior to this as well  ? -have discussed with patient multiple times ?  ?Rec healthy diet and exrecise  ?Foot exam declined 12/20/21  ? ?Provider: Dr. Olivia Mackie McLean-Scocuzza-Internal Medicine  ?

## 2021-12-20 NOTE — Patient Instructions (Addendum)
Check blood pressure and make sure you have 20 mg bottle of micardis/telmisartan  ?Check with [facility] for eye exam tell them you have history of diabetes and to fax me the records [phone]  ? Latest Reference Range & Units 07/25/19 09:02 04/01/20 10:38 04/27/21 11:09  ?Hemoglobin A1C 4.6 - 6.5 % 6.4 6.3 6.1  ? ? ?Get an eye exam please at [facility] left pupil is larger than the right  ?Consider CT scan of the brain let me know  ? ? ? ?

## 2021-12-21 ENCOUNTER — Telehealth: Payer: Self-pay | Admitting: Internal Medicine

## 2021-12-21 LAB — URINALYSIS, ROUTINE W REFLEX MICROSCOPIC
Bilirubin Urine: NEGATIVE
Glucose, UA: NEGATIVE
Hyaline Cast: NONE SEEN /LPF
Nitrite: NEGATIVE
Specific Gravity, Urine: 1.027 (ref 1.001–1.035)
WBC, UA: >=60 /HPF — AB (ref 0–5)
pH: <=5 (ref 5.0–8.0)

## 2021-12-21 LAB — MICROALBUMIN / CREATININE URINE RATIO
Creatinine, Urine: 368 mg/dL — ABNORMAL HIGH (ref 20–275)
Microalb Creat Ratio: 5 mcg/mg creat (ref <30)
Microalb, Ur: 1.7 mg/dL

## 2021-12-21 LAB — URINE CULTURE
MICRO NUMBER:: 13098238
SPECIMEN QUALITY:: ADEQUATE

## 2021-12-21 LAB — MICROSCOPIC MESSAGE

## 2021-12-21 NOTE — Telephone Encounter (Signed)
Requesting  lab results from 12/20/21. ?

## 2021-12-22 NOTE — Telephone Encounter (Signed)
Patient returned office phone call for lab results. 

## 2021-12-22 NOTE — Telephone Encounter (Signed)
See result note-patient aware 

## 2021-12-22 NOTE — Telephone Encounter (Signed)
Patient called and wanted to get her lab resutls ?

## 2021-12-22 NOTE — Telephone Encounter (Signed)
Left message to call office back concerning lab results ?

## 2021-12-26 ENCOUNTER — Other Ambulatory Visit: Payer: Self-pay | Admitting: Internal Medicine

## 2021-12-26 DIAGNOSIS — E1122 Type 2 diabetes mellitus with diabetic chronic kidney disease: Secondary | ICD-10-CM

## 2022-03-13 ENCOUNTER — Other Ambulatory Visit: Payer: Self-pay | Admitting: Internal Medicine

## 2022-03-13 DIAGNOSIS — E876 Hypokalemia: Secondary | ICD-10-CM

## 2022-04-17 ENCOUNTER — Other Ambulatory Visit: Payer: Self-pay | Admitting: Internal Medicine

## 2022-04-17 DIAGNOSIS — E1122 Type 2 diabetes mellitus with diabetic chronic kidney disease: Secondary | ICD-10-CM

## 2022-05-29 ENCOUNTER — Other Ambulatory Visit: Payer: Self-pay | Admitting: Internal Medicine

## 2022-05-29 ENCOUNTER — Other Ambulatory Visit: Payer: Self-pay

## 2022-05-29 DIAGNOSIS — I10 Essential (primary) hypertension: Secondary | ICD-10-CM

## 2022-05-29 DIAGNOSIS — E1122 Type 2 diabetes mellitus with diabetic chronic kidney disease: Secondary | ICD-10-CM

## 2022-06-02 ENCOUNTER — Other Ambulatory Visit: Payer: Self-pay

## 2022-06-02 ENCOUNTER — Other Ambulatory Visit: Payer: Self-pay | Admitting: Internal Medicine

## 2022-06-02 ENCOUNTER — Other Ambulatory Visit: Payer: Self-pay | Admitting: Family

## 2022-06-02 DIAGNOSIS — E1122 Type 2 diabetes mellitus with diabetic chronic kidney disease: Secondary | ICD-10-CM

## 2022-06-02 MED ORDER — TRUE METRIX BLOOD GLUCOSE TEST VI STRP: ORAL_STRIP | 3 refills | Status: AC

## 2022-06-02 MED ORDER — TRUEPLUS LANCETS 28G MISC: 1 refills | Status: AC

## 2022-06-19 ENCOUNTER — Other Ambulatory Visit: Payer: Self-pay | Admitting: Internal Medicine

## 2022-06-19 DIAGNOSIS — E1122 Type 2 diabetes mellitus with diabetic chronic kidney disease: Secondary | ICD-10-CM

## 2022-06-19 MED ORDER — TRUE METRIX BLOOD GLUCOSE TEST VI STRP: ORAL_STRIP | 3 refills | Status: AC

## 2022-06-19 MED ORDER — TRUE METRIX BLOOD GLUCOSE TEST VI STRP: ORAL_STRIP | 0 refills | Status: DC

## 2022-06-22 ENCOUNTER — Ambulatory Visit: Payer: Medicare HMO | Admitting: Internal Medicine

## 2022-07-05 ENCOUNTER — Ambulatory Visit: Payer: Medicare HMO | Admitting: Internal Medicine

## 2022-07-11 ENCOUNTER — Other Ambulatory Visit: Payer: Self-pay | Admitting: Internal Medicine

## 2022-07-11 ENCOUNTER — Ambulatory Visit (INDEPENDENT_AMBULATORY_CARE_PROVIDER_SITE_OTHER): Payer: Medicare PPO | Admitting: Internal Medicine

## 2022-07-11 ENCOUNTER — Encounter: Payer: Self-pay | Admitting: Internal Medicine

## 2022-07-11 VITALS — BP 118/64 | HR 70 | Temp 98.3°F | Ht 64.0 in | Wt 174.8 lb

## 2022-07-11 DIAGNOSIS — E538 Deficiency of other specified B group vitamins: Secondary | ICD-10-CM

## 2022-07-11 DIAGNOSIS — E1122 Type 2 diabetes mellitus with diabetic chronic kidney disease: Secondary | ICD-10-CM

## 2022-07-11 DIAGNOSIS — I152 Hypertension secondary to endocrine disorders: Secondary | ICD-10-CM | POA: Diagnosis not present

## 2022-07-11 DIAGNOSIS — Z0000 Encounter for general adult medical examination without abnormal findings: Secondary | ICD-10-CM | POA: Diagnosis not present

## 2022-07-11 DIAGNOSIS — E1159 Type 2 diabetes mellitus with other circulatory complications: Secondary | ICD-10-CM

## 2022-07-11 DIAGNOSIS — Z23 Encounter for immunization: Secondary | ICD-10-CM | POA: Diagnosis not present

## 2022-07-11 DIAGNOSIS — E559 Vitamin D deficiency, unspecified: Secondary | ICD-10-CM | POA: Diagnosis not present

## 2022-07-11 DIAGNOSIS — E611 Iron deficiency: Secondary | ICD-10-CM

## 2022-07-11 DIAGNOSIS — D509 Iron deficiency anemia, unspecified: Secondary | ICD-10-CM

## 2022-07-11 DIAGNOSIS — I10 Essential (primary) hypertension: Secondary | ICD-10-CM

## 2022-07-11 DIAGNOSIS — N1832 Chronic kidney disease, stage 3b: Secondary | ICD-10-CM

## 2022-07-11 DIAGNOSIS — E876 Hypokalemia: Secondary | ICD-10-CM

## 2022-07-11 DIAGNOSIS — E785 Hyperlipidemia, unspecified: Secondary | ICD-10-CM | POA: Diagnosis not present

## 2022-07-11 LAB — CBC WITH DIFFERENTIAL/PLATELET
Basophils Absolute: 0 10*3/uL (ref 0.0–0.1)
Basophils Relative: 0.5 % (ref 0.0–3.0)
Eosinophils Absolute: 0.1 10*3/uL (ref 0.0–0.7)
Eosinophils Relative: 1.3 % (ref 0.0–5.0)
HCT: 35 % — ABNORMAL LOW (ref 36.0–46.0)
Hemoglobin: 11.5 g/dL — ABNORMAL LOW (ref 12.0–15.0)
Lymphocytes Relative: 25 % (ref 12.0–46.0)
Lymphs Abs: 1.7 10*3/uL (ref 0.7–4.0)
MCHC: 32.9 g/dL (ref 30.0–36.0)
MCV: 91 fl (ref 78.0–100.0)
Monocytes Absolute: 0.5 10*3/uL (ref 0.1–1.0)
Monocytes Relative: 7.6 % (ref 3.0–12.0)
Neutro Abs: 4.4 10*3/uL (ref 1.4–7.7)
Neutrophils Relative %: 65.6 % (ref 43.0–77.0)
Platelets: 250 10*3/uL (ref 150.0–400.0)
RBC: 3.85 Mil/uL — ABNORMAL LOW (ref 3.87–5.11)
RDW: 14.6 % (ref 11.5–15.5)
WBC: 6.7 10*3/uL (ref 4.0–10.5)

## 2022-07-11 LAB — COMPREHENSIVE METABOLIC PANEL
ALT: 9 U/L (ref 0–35)
AST: 15 U/L (ref 0–37)
Albumin: 4.1 g/dL (ref 3.5–5.2)
Alkaline Phosphatase: 58 U/L (ref 39–117)
BUN: 17 mg/dL (ref 6–23)
CO2: 30 mEq/L (ref 19–32)
Calcium: 9.7 mg/dL (ref 8.4–10.5)
Chloride: 101 mEq/L (ref 96–112)
Creatinine, Ser: 1.33 mg/dL — ABNORMAL HIGH (ref 0.40–1.20)
GFR: 39.75 mL/min — ABNORMAL LOW (ref >60.00)
Glucose, Bld: 107 mg/dL — ABNORMAL HIGH (ref 70–99)
Potassium: 4 mEq/L (ref 3.5–5.1)
Sodium: 137 mEq/L (ref 135–145)
Total Bilirubin: 0.6 mg/dL (ref 0.2–1.2)
Total Protein: 7.1 g/dL (ref 6.0–8.3)

## 2022-07-11 LAB — LIPID PANEL
Cholesterol: 138 mg/dL (ref 0–200)
HDL: 55.1 mg/dL (ref >39.00)
LDL Cholesterol: 70 mg/dL (ref 0–99)
NonHDL: 82.52
Total CHOL/HDL Ratio: 2
Triglycerides: 64 mg/dL (ref 0.0–149.0)
VLDL: 12.8 mg/dL (ref 0.0–40.0)

## 2022-07-11 LAB — IBC + FERRITIN
Ferritin: 108.6 ng/mL (ref 10.0–291.0)
Iron: 80 ug/dL (ref 42–145)
Saturation Ratios: 32.8 % (ref 20.0–50.0)
TIBC: 243.6 ug/dL — ABNORMAL LOW (ref 250.0–450.0)
Transferrin: 174 mg/dL — ABNORMAL LOW (ref 212.0–360.0)

## 2022-07-11 LAB — VITAMIN B12: Vitamin B-12: 204 pg/mL — ABNORMAL LOW (ref 211–911)

## 2022-07-11 LAB — VITAMIN D 25 HYDROXY (VIT D DEFICIENCY, FRACTURES): VITD: 45.31 ng/mL (ref 30.00–100.00)

## 2022-07-11 LAB — HEMOGLOBIN A1C: Hgb A1c MFr Bld: 6.4 % (ref 4.6–6.5)

## 2022-07-11 MED ORDER — MULTIVITAMIN ADULT PO TABS: 1.0000 | ORAL_TABLET | Freq: Every day | ORAL | 3 refills | Status: AC

## 2022-07-11 MED ORDER — TELMISARTAN 20 MG PO TABS: 20.0000 mg | ORAL_TABLET | Freq: Every day | ORAL | 3 refills | Status: AC

## 2022-07-11 MED ORDER — ATORVASTATIN CALCIUM 40 MG PO TABS: ORAL_TABLET | ORAL | 3 refills | Status: AC

## 2022-07-11 MED ORDER — ASPIRIN 81 MG PO TBEC: 81.0000 mg | DELAYED_RELEASE_TABLET | Freq: Every day | ORAL | 3 refills | Status: AC

## 2022-07-11 MED ORDER — GLIPIZIDE-METFORMIN HCL 5-500 MG PO TABS: 1.0000 | ORAL_TABLET | Freq: Two times a day (BID) | ORAL | 3 refills | Status: AC

## 2022-07-11 MED ORDER — VITAMIN D3 50 MCG (2000 UT) PO TABS: 2000.0000 [IU] | ORAL_TABLET | Freq: Every day | ORAL | 3 refills | Status: AC

## 2022-07-11 MED ORDER — VITAMIN B-12 1000 MCG PO TABS: 1000.0000 ug | ORAL_TABLET | Freq: Every day | ORAL | 3 refills | Status: AC

## 2022-07-11 MED ORDER — AMLODIPINE BESYLATE 5 MG PO TABS: ORAL_TABLET | ORAL | 3 refills | Status: AC

## 2022-07-11 MED ORDER — POTASSIUM CHLORIDE CRYS ER 10 MEQ PO TBCR: 10.0000 meq | EXTENDED_RELEASE_TABLET | Freq: Every day | ORAL | 3 refills | Status: AC

## 2022-07-11 MED ORDER — IRON (FERROUS SULFATE) 325 (65 FE) MG PO TABS: 325.0000 mg | ORAL_TABLET | Freq: Every day | ORAL | 3 refills | Status: AC

## 2022-07-11 NOTE — Progress Notes (Addendum)
Chief Complaint  Patient presents with   Follow-up    6 month f/u    Annual  1. 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  HENT:  Negative for hearing loss.   Eyes:  Negative for blurred vision.  Respiratory:  Negative for shortness of breath.   Cardiovascular:  Negative for chest pain.  Gastrointestinal:  Negative for abdominal pain and blood in stool.  Genitourinary:  Negative for dysuria.  Musculoskeletal:  Negative for falls and joint pain.  Skin:  Negative for rash.  Neurological:  Negative for headaches.  Psychiatric/Behavioral:  Negative for depression.    Past Medical History:  Diagnosis Date   Arthritis    knees   Chronic kidney disease    Diabetes mellitus without complication (HCC)    Hyperlipidemia    Hypertension    Obesity    Past Surgical History:  Procedure Laterality Date   EYE SURGERY     b/l cataract 2004    Family History  Problem Relation Age of Onset   Arthritis Sister    Hypertension Sister    Hyperlipidemia Sister    Diabetes Son    Mental illness Son        bipolar/cognitive deficit    Hyperlipidemia Son    Hypertension Son    Arthritis Sister    Hypertension Sister    Hyperlipidemia Sister    Arthritis Sister    Hypertension Sister    Hyperlipidemia Sister    Arthritis Sister    Hyperlipidemia Sister    Hypertension Sister    Cancer Mother        colon cancer    Arthritis Mother    Cancer Father        lung, smoker   Social History   Socioeconomic History   Marital status: Unknown    Spouse name: Not on file   Number of children: Not on file   Years of education: Not on file   Highest education level: Not on file  Occupational History   Not on file  Tobacco Use   Smoking status: Never   Smokeless tobacco: Never  Substance and Sexual Activity    Alcohol use: No   Drug use: No   Sexual activity: Never    Partners: Male  Other Topics Concern   Not on file  Social History Narrative   No etoh, drugs, never smoker    Walks her dog    No guns, wears seatbelt    Lives with son age 76 as of 08/2019 had her age 71   Used to be Cytogeneticist    Social Determinants of Health   Financial Resource Strain: Low Risk  (09/14/2020)   Overall Financial Resource Strain (CARDIA)    Difficulty of Paying Living Expenses: Not hard at all  Food Insecurity: No Food Insecurity (09/14/2020)   Hunger Vital Sign    Worried About Running Out of Food in the Last Year: Never true    Ran Out of Food in the Last Year: Never true  Transportation Needs: No Transportation Needs (09/14/2020)   PRAPARE - Administrator, Civil Service (Medical): No    Lack of Transportation (Non-Medical): No  Physical Activity: Sufficiently Active (09/09/2019)   Exercise Vital Sign  Days of Exercise per Week: 7 days    Minutes of Exercise per Session: 30 min  Stress: No Stress Concern Present (09/14/2020)   [place]    Feeling of Stress : Not at all  Social Connections: Unknown (09/14/2020)   Social Connection and Isolation Panel [NHANES]    Frequency of Communication with Friends and Family: More than three times a week    Frequency of Social Gatherings with Friends and Family: Not on file    Attends Religious Services: Not on file    Active Member of Clubs or Organizations: Not on file    Attends Archivist Meetings: Not on file    Marital Status: Not on file  Intimate Partner Violence: Not At Risk (09/14/2020)   Humiliation, Afraid, Rape, and Kick questionnaire    Fear of Current or Ex-Partner: No    Emotionally Abused: No    Physically Abused: No    Sexually Abused: No   Current Meds  Medication Sig   amLODipine (NORVASC) 5 MG tablet TAKE 1 TABLET (5 MG TOTAL) BY MOUTH  DAILY. DO NOT CUT IN HALF. STOP $RemoveBe'10MG'zPYjTKKiq$  DOSE AND TAKE ONE WHOLE $RemoveB'5MG'VnwNjdgv$  DOSE   ASPIRIN LOW DOSE 81 MG tablet TAKE 1 TABLET EVERY DAY   atorvastatin (LIPITOR) 40 MG tablet TAKE 1 TABLET (40 MG TOTAL) BY MOUTH DAILY AT 6 PM. (STOP ZOCOR $RemoveBef'20MG'wOcBeLVlbo$ )   Cholecalciferol (VITAMIN D3 PO) Take 2,000 Units by mouth daily.   glipiZIDE-metformin (METAGLIP) 5-500 MG tablet TAKE 2 TABLETS IN THE MORNING WITH FOOD AND TAKE 1 TABLET AT NIGHT   Multiple Vitamin (MULTIVITAMIN ADULT PO) Take by mouth.   potassium chloride (KLOR-CON M) 10 MEQ tablet TAKE 1 TABLET EVERY DAY   telmisartan (MICARDIS) 20 MG tablet Take 1 tablet (20 mg total) by mouth daily. Take 1/2 pill of 40 mg and once finished take 1 20 mg pill.   TRUE METRIX BLOOD GLUCOSE TEST test strip Bid E11.9   TRUE METRIX BLOOD GLUCOSE TEST test strip E11.59 TEST TWO TIMES DAILY   TRUE METRIX BLOOD GLUCOSE TEST test strip TEST TWO TIMES DAILY e11.59 of for accucheck strips   TRUEplus Lancets 28G MISC TEST BLOOD GLUCOSE TWICE DAILY AS DIRECTED   No Known Allergies No results found for this or any previous visit (from the past 2160 hour(s)). Objective  Body mass index is 30 kg/m. Wt Readings from Last 3 Encounters:  07/11/22 174 lb 12.8 oz (79.3 kg)  12/20/21 171 lb 3.2 oz (77.7 kg)  09/15/21 169 lb (76.7 kg)   Temp Readings from Last 3 Encounters:  07/11/22 98.3 F (36.8 C) (Oral)  12/20/21 98.2 F (36.8 C) (Oral)  04/27/21 97.9 F (36.6 C) (Oral)   BP Readings from Last 3 Encounters:  07/11/22 118/64  12/20/21 130/78  04/27/21 120/62   Pulse Readings from Last 3 Encounters:  07/11/22 70  12/20/21 67  04/27/21 (!) 59    Physical Exam Vitals and nursing note reviewed.  Constitutional:      Appearance: Normal appearance. She is well-developed and well-groomed.  HENT:     Head: Normocephalic and atraumatic.  Eyes:     Conjunctiva/sclera: Conjunctivae normal.     Pupils: Pupils are equal, round, and reactive to light.  Cardiovascular:     Rate  and Rhythm: Normal rate and regular rhythm.     Heart sounds: Normal heart sounds. No murmur heard. Pulmonary:     Effort: Pulmonary effort is normal.  Breath sounds: Normal breath sounds.  Abdominal:     General: Abdomen is flat. Bowel sounds are normal.     Tenderness: There is no abdominal tenderness.  Musculoskeletal:        General: No tenderness.  Skin:    General: Skin is warm and dry.  Neurological:     General: No focal deficit present.     Mental Status: She is alert and oriented to person, place, and time. Mental status is at baseline.     Cranial Nerves: Cranial nerves 2-12 are intact.     Motor: Motor function is intact.     Coordination: Coordination is intact.     Gait: Gait is intact.  Psychiatric:        Attention and Perception: Attention and perception normal.        Mood and Affect: Mood and affect normal.        Speech: Speech normal.        Behavior: Behavior normal. Behavior is cooperative.        Thought Content: Thought content normal.        Cognition and Memory: Cognition and memory normal.        Judgment: Judgment normal.     Assessment  Plan  Annual physical exam See below   Need for immunization against influenza - Plan: Flu Vaccine QUAD High Dose(Fluad)  Stage 3b chronic kidney disease ([place]) - Plan: CBC w/Diff Hypertension associated with diabetes ([place]) - Plan: CBC w/Diff, Comp Met (CMET), Lipid Profile, HgB A1c   norvasc 5 mg qd, telmisartan 20 mg qd and metaglip 5-500 2 in am and 1 pm reduce to 1 bid due to CKD 3b  Consider renal referral in the future and monitor renal function  lipitor 40 mg qhs    HM Flu shot utd given today prevnar had, pna 23 due pt declines 04/27/21 Shingrix Rx 12/20/21 Tdap disc'ed will give Rx at f/u declines for now as of 09/09/19  covid shots 4/4 rec D3 2000 IU qd    Declines mammo, pap, colonoscopy, cologaurd ( mother died of colon cancer not candidate) consider fobt stool tests in future (pt to have  through insurance mail order and will fax me result of fobt) disc DEXA pt declines prev and today 04/27/21, 12/20/21   Declines all health maintenance as of 09/09/19 and 04/01/20 and 04/27/21, 12/20/21, 07/11/22 and denied prior to this as well   -have discussed with patient multiple times   Rec healthy diet and exrecise  Foot exam declined 12/20/21     Provider: Dr. Olivia Mackie McLean-Scocuzza-Internal Medicine

## 2022-07-11 NOTE — Addendum Note (Signed)
Addended by: Orland Mustard on: 07/11/2022 04:47 PM   Modules accepted: Orders

## 2022-07-11 NOTE — Patient Instructions (Addendum)
If desired new covid shot is at [facility]   Consider shingles x 2 doses (shingrix 2nd dose before 6 months)   Schedule eye exam at [facility]   Latest Reference Range & Units 12/20/21 11:38  Hemoglobin A1C 4.6 - 6.5 % 6.0

## 2022-08-14 ENCOUNTER — Emergency Department
Admission: EM | Admit: 2022-08-14 | Discharge: 2022-08-16 | Disposition: E | Payer: Medicare PPO | Attending: Emergency Medicine | Admitting: Emergency Medicine

## 2022-08-14 DIAGNOSIS — E1122 Type 2 diabetes mellitus with diabetic chronic kidney disease: Secondary | ICD-10-CM | POA: Diagnosis not present

## 2022-08-14 DIAGNOSIS — R0902 Hypoxemia: Secondary | ICD-10-CM | POA: Diagnosis not present

## 2022-08-14 DIAGNOSIS — I129 Hypertensive chronic kidney disease with stage 1 through stage 4 chronic kidney disease, or unspecified chronic kidney disease: Secondary | ICD-10-CM | POA: Diagnosis not present

## 2022-08-14 DIAGNOSIS — N189 Chronic kidney disease, unspecified: Secondary | ICD-10-CM | POA: Insufficient documentation

## 2022-08-14 DIAGNOSIS — R55 Syncope and collapse: Secondary | ICD-10-CM | POA: Diagnosis not present

## 2022-08-14 DIAGNOSIS — I469 Cardiac arrest, cause unspecified: Secondary | ICD-10-CM

## 2022-08-14 DIAGNOSIS — R000 Tachycardia, unspecified: Secondary | ICD-10-CM | POA: Diagnosis not present

## 2022-08-14 DIAGNOSIS — R404 Transient alteration of awareness: Secondary | ICD-10-CM | POA: Diagnosis not present

## 2022-08-14 DIAGNOSIS — R0689 Other abnormalities of breathing: Secondary | ICD-10-CM | POA: Diagnosis not present

## 2022-08-14 LAB — BASIC METABOLIC PANEL
Anion gap: 16 — ABNORMAL HIGH (ref 5–15)
BUN: 19 mg/dL (ref 8–23)
CO2: 17 mmol/L — ABNORMAL LOW (ref 22–32)
Calcium: 9.2 mg/dL (ref 8.9–10.3)
Chloride: 106 mmol/L (ref 98–111)
Creatinine, Ser: 1.9 mg/dL — ABNORMAL HIGH (ref 0.44–1.00)
GFR, Estimated: 28 mL/min — ABNORMAL LOW (ref >60)
Glucose, Bld: 324 mg/dL — ABNORMAL HIGH (ref 70–99)
Potassium: 3.9 mmol/L (ref 3.5–5.1)
Sodium: 139 mmol/L (ref 135–145)

## 2022-08-14 LAB — CBC
HCT: 36 % (ref 36.0–46.0)
Hemoglobin: 10.9 g/dL — ABNORMAL LOW (ref 12.0–15.0)
MCH: 29 pg (ref 26.0–34.0)
MCHC: 30.3 g/dL (ref 30.0–36.0)
MCV: 95.7 fL (ref 80.0–100.0)
Platelets: 123 10*3/uL — ABNORMAL LOW (ref 150–400)
RBC: 3.76 MIL/uL — ABNORMAL LOW (ref 3.87–5.11)
RDW: 14.1 % (ref 11.5–15.5)
WBC: 9.6 10*3/uL (ref 4.0–10.5)
nRBC: 0.4 % — ABNORMAL HIGH (ref 0.0–0.2)

## 2022-08-14 LAB — TROPONIN I (HIGH SENSITIVITY): Troponin I (High Sensitivity): 38 ng/L — ABNORMAL HIGH (ref <18)

## 2022-08-14 LAB — CBG MONITORING, ED: Glucose-Capillary: 197 mg/dL — ABNORMAL HIGH (ref 70–99)

## 2022-08-16 NOTE — ED Notes (Signed)
Epi in

## 2022-08-16 NOTE — ED Notes (Signed)
Pulse check, PEA 

## 2022-08-16 NOTE — ED Notes (Signed)
EDP with [place] at bedside

## 2022-08-16 NOTE — ED Notes (Signed)
Pulse check, resume compressions

## 2022-08-16 NOTE — ED Provider Notes (Signed)
[facility] Provider Note    Event Date/Time   First MD Initiated Contact with Patient 09-08-22 1710     (approximate)   History   Chief Complaint Cardiac Arrest  HPI  Ebony Reese is a 71 y.o. female with past medical history of hypertension, hyperlipidemia, diabetes, and CKD who presents to the ED for cardiac arrest.  Per EMS, they received a call from patient's family that she was found unresponsive in her bed.  When EMS arrived, they found patient unresponsive and bradycardic with blood pressure of 40 systolic.  In the process of transferring her, patient lost pulses and CPR was initiated.  EMS reports PEA throughout transport with multiple rounds of epinephrine, i-gel airway was placed.  Patient was reportedly feeling well earlier in the evening and had eaten dinner with family before going to her room.     Physical Exam   Triage Vital Signs: ED Triage Vitals  Enc Vitals Group     BP 09-08-2022 1652 (!) 144/83     Pulse Rate 2022/09/08 1652 (!) 101     Resp 09/08/2022 1653 (!) 29     Temp --      Temp src --      SpO2 2022-09-08 1652 (!) 53 %     Weight --      Height --      Head Circumference --      Peak Flow --      Pain Score --      Pain Loc --      Pain Edu? --      Excl. in [place]? --     Most recent vital signs: Vitals:   08-Sep-2022 1659 09/08/2022 1700  BP:  (!) 90/21  Pulse: (!) 119 (!) 101  Resp:  (!) 40  SpO2: (!) 68% (!) 50%    Constitutional: Unresponsive. Eyes: Conjunctivae are normal.  Pupils fixed. Head: Atraumatic. Nose: No congestion/rhinnorhea. Mouth/Throat: Mucous membranes are moist.  Cardiovascular: No audible heart sounds or palpable pulses. Respiratory: I-gel airway in place, lungs clear to auscultation bilaterally. Gastrointestinal: Soft and nondistended. Musculoskeletal: No lower extremity edema.  Neurologic: Unresponsive, no response to painful stimuli.    ED Results / Procedures / Treatments   Labs (all  labs ordered are listed, but only abnormal results are displayed) Labs Reviewed  BASIC METABOLIC PANEL - Abnormal; Notable for the following components:      Result Value   CO2 17 (*)    Glucose, Bld 324 (*)    Creatinine, Ser 1.90 (*)    GFR, Estimated 28 (*)    Anion gap 16 (*)    All other components within normal limits  CBC - Abnormal; Notable for the following components:   RBC 3.76 (*)    Hemoglobin 10.9 (*)    Platelets 123 (*)    nRBC 0.4 (*)    All other components within normal limits  CBG MONITORING, ED - Abnormal; Notable for the following components:   Glucose-Capillary 197 (*)    All other components within normal limits  TROPONIN I (HIGH SENSITIVITY) - Abnormal; Notable for the following components:   Troponin I (High Sensitivity) 38 (*)    All other components within normal limits    PROCEDURES:  Critical Care performed: Yes, see critical care procedure note(s)  .Critical Care  Performed by: Blake Divine, MD Authorized by: Blake Divine, MD   Critical care provider statement:    Critical care time (minutes):  30  Critical care time was exclusive of:  Separately billable procedures and treating other patients and teaching time   Critical care was necessary to treat or prevent imminent or life-threatening deterioration of the following conditions:  Cardiac failure   Critical care was time spent personally by me on the following activities:  Development of treatment plan with patient or surrogate, discussions with consultants, evaluation of patient's response to treatment, examination of patient, ordering and review of laboratory studies, ordering and review of radiographic studies, ordering and performing treatments and interventions, pulse oximetry, re-evaluation of patient's condition and review of old charts   I assumed direction of critical care for this patient from another provider in my specialty: no   Procedure Name: Intubation Date/Time: 09/13/2022  5:41 PM  Performed by: Chesley Noon, MDPre-anesthesia Checklist: Patient identified, Patient being monitored, Emergency Drugs available, Timeout performed and Suction available Oxygen Delivery Method: Non-rebreather mask Preoxygenation: Pre-oxygenation with 100% oxygen Induction Type: Rapid sequence Ventilation: Mask ventilation without difficulty Laryngoscope Size: Glidescope and 4 Grade View: Grade I Number of attempts: 1 Airway Equipment and Method: Video-laryngoscopy Placement Confirmation: ETT inserted through vocal cords under direct vision, CO2 detector and Breath sounds checked- equal and bilateral Secured at: 24 cm Tube secured with: ETT holder Dental Injury: Teeth and Oropharynx as per pre-operative assessment        MEDICATIONS ORDERED IN ED: Medications - No data to display   IMPRESSION / MDM / ASSESSMENT AND PLAN / ED COURSE  I reviewed the triage vital signs and the nursing notes.                              71 y.o. female with past medical history of hypertension, hyperlipidemia, diabetes, and CKD who presents to the ED after being found unresponsive at home, quickly deteriorated to cardiac arrest and PEA with EMS.  Patient's presentation is most consistent with acute presentation with potential threat to life or bodily function.  Differential diagnosis includes, but is not limited to, STEMI, ACS, PE, sepsis, bradycardia, electrolyte abnormality, renal failure, intracranial process, respiratory failure.  Patient unresponsive with CPR ongoing via [facility] device on arrival to the ED, no palpable femoral or carotid pulses at the time of arrival.  Patient given additional rounds of epinephrine and PEA noted on the monitor throughout.  EMS did transmit prehospital EKG prior to cardiac arrest that showed bradycardia with ST changes concerning for STEMI.  There was also questionable QRS widening potentially due to hyperkalemia, patient given multiple rounds of calcium  along with epinephrine and bicarb with no change in PEA.  Bedside echocardiogram shows cardiac standstill at pulse check with no significant effusion or RV dilation.  Decision was made to stop efforts at resuscitation after approximately 20 minutes, time of death was called.  I spoke with family and they were allowed time with the patient to grieve.      FINAL CLINICAL IMPRESSION(S) / ED DIAGNOSES   Final diagnoses:  Cardiac arrest [facility])     Rx / DC Orders   ED Discharge Orders     None        Note:  This document was prepared using Dragon voice recognition software and may include unintentional dictation errors.   Chesley Noon, MD 09/13/2022 [facility]

## 2022-08-16 NOTE — ED Notes (Signed)
7.5 tube, 25 at the lip

## 2022-08-16 NOTE — ED Notes (Signed)
Time of death 17:09 called by Charna Archer, MD

## 2022-08-16 NOTE — ED Notes (Signed)
Pulse check, resume cpr 

## 2022-08-16 NOTE — ED Notes (Signed)
Bicarb given/ pulse check, cpr resumed

## 2022-08-16 NOTE — ED Notes (Signed)
Epi in Calcium in 

## 2022-08-16 NOTE — ED Notes (Signed)
Pulse check, resume cpr

## 2022-08-16 NOTE — ED Notes (Signed)
Pt IVC/ still no consult orders / moved to bhu

## 2022-08-16 NOTE — ED Notes (Addendum)
Pt found unresponsive at home in her bed. Acems measured a blood pressure of 40/10. Pt arrived with lucas in place. Pt was given 3 rounds of epi with no change. Pt's last known norm was 10/29 at dinner.

## 2022-08-16 NOTE — ED Notes (Signed)
Pulse check, no pulse continue compressions

## 2022-08-16 NOTE — ED Notes (Signed)
Epi in Calcium in

## 2022-08-16 NOTE — ED Notes (Signed)
Ed provider attempting to intubate

## 2022-08-16 DEATH — deceased

## 2022-08-17 ENCOUNTER — Telehealth: Payer: Self-pay

## 2022-08-17 NOTE — Telephone Encounter (Signed)
[facility] home called requesting that the death certificate needs to be completed. 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

## 2023-01-10 ENCOUNTER — Encounter: Payer: Medicare PPO | Admitting: Family Medicine
